# Patient Record
Sex: Female | Born: 1985 | Race: Black or African American | Hispanic: No | Marital: Single | Smoking: Never smoker
Health system: Southern US, Community
[De-identification: ages and names within clinical notes are randomized; demographics above are authoritative.]

## PROBLEM LIST (undated history)

## (undated) DIAGNOSIS — R51 Headache: Secondary | ICD-10-CM

## (undated) DIAGNOSIS — R519 Headache, unspecified: Secondary | ICD-10-CM

## (undated) DIAGNOSIS — D649 Anemia, unspecified: Secondary | ICD-10-CM

## (undated) DIAGNOSIS — J329 Chronic sinusitis, unspecified: Secondary | ICD-10-CM

## (undated) DIAGNOSIS — T7840XA Allergy, unspecified, initial encounter: Secondary | ICD-10-CM

## (undated) HISTORY — DX: Allergy, unspecified, initial encounter: T78.40XA

## (undated) HISTORY — PX: FINGER SURGERY: SHX640

## (undated) HISTORY — DX: Anemia, unspecified: D64.9

## (undated) HISTORY — PX: NO PAST SURGERIES: SHX2092

---

## 2013-08-21 ENCOUNTER — Encounter (HOSPITAL_COMMUNITY): Payer: Self-pay | Admitting: *Deleted

## 2013-08-21 ENCOUNTER — Inpatient Hospital Stay (HOSPITAL_COMMUNITY)
Admission: AD | Admit: 2013-08-21 | Discharge: 2013-08-21 | Disposition: A | Discharge status: Home / Self Care | Payer: Self-pay | Source: Ambulatory Visit | Attending: Family Medicine | Admitting: Family Medicine

## 2013-08-21 DIAGNOSIS — J029 Acute pharyngitis, unspecified: Secondary | ICD-10-CM

## 2013-08-21 DIAGNOSIS — R51 Headache: Secondary | ICD-10-CM | POA: Insufficient documentation

## 2013-08-21 HISTORY — DX: Chronic sinusitis, unspecified: J32.9

## 2013-08-21 LAB — URINALYSIS, ROUTINE W REFLEX MICROSCOPIC
Bilirubin Urine: NEGATIVE
GLUCOSE, UA: NEGATIVE mg/dL
Ketones, ur: NEGATIVE mg/dL
Leukocytes, UA: NEGATIVE
Nitrite: NEGATIVE
PH: 5 (ref 5.0–8.0)
Protein, ur: NEGATIVE mg/dL
Specific Gravity, Urine: 1.025 (ref 1.005–1.030)
Urobilinogen, UA: 0.2 mg/dL (ref 0.0–1.0)

## 2013-08-21 LAB — URINE MICROSCOPIC-ADD ON

## 2013-08-21 LAB — POCT PREGNANCY, URINE: PREG TEST UR: NEGATIVE

## 2013-08-21 MED ORDER — AMOXICILLIN 500 MG PO CAPS: 500.0000 mg | ORAL_CAPSULE | Freq: Three times a day (TID) | ORAL | Status: DC

## 2013-08-21 NOTE — MAU Provider Note (Signed)
  History     CSN: [id number]  Arrival date and time: 08/21/13 [id number]   First Provider Initiated Contact with Patient 08/21/13 1946      Chief Complaint  Patient presents with  . Headache  . Dizziness   HPI Comments: Madison Kent 28 y.o. G0P0 presents to MAU with sore throat, body aches, fevers and vertigo for 1 week. She has been taking Motrin for fevers and body aches. Her right ear also hurts her. No contact with others who are ill.    Headache  Associated symptoms include dizziness, a fever and a sore throat.  Dizziness Associated symptoms include a fever, headaches and a sore throat.      Past Medical History  Diagnosis Date  . Sinusitis nasal     Past Surgical History  Procedure Laterality Date  . No past surgeries      History reviewed. No pertinent family history.  History  Substance Use Topics  . Smoking status: Never Smoker   . Smokeless tobacco: Not on file  . Alcohol Use: No    Allergies: No Known Allergies  Prescriptions prior to admission  Medication Sig Dispense Refill  . ibuprofen (ADVIL,MOTRIN) 200 MG tablet Take 400 mg by mouth every 6 (six) hours as needed for headache.      . Vitamin D, Ergocalciferol, (DRISDOL) 50000 UNITS CAPS capsule Take 50,000 Units by mouth every 7 (seven) days.        Review of Systems  Constitutional: Positive for fever.  HENT: Positive for sore throat.   Respiratory: Negative.   Cardiovascular: Negative.   Gastrointestinal: Negative.   Genitourinary: Negative.   Musculoskeletal: Negative.   Neurological: Positive for dizziness and headaches.  Psychiatric/Behavioral: Negative.    Physical Exam   Blood pressure 110/71, pulse 81, temperature 98.2 F (36.8 C), temperature source Oral, resp. rate 16, height 5\' 8"  (1.727 m), weight 69.4 kg (153 lb), last menstrual period 08/20/2013.  Physical Exam  Constitutional: She is oriented to person, place, and time. She appears well-developed and well-nourished. No  distress.  HENT:  Head: Normocephalic and atraumatic.  Right Ear: Hearing normal. Tympanic membrane is scarred.  Left Ear: Hearing normal. Tympanic membrane is scarred.  Mouth/Throat: Posterior oropharyngeal erythema present. No oropharyngeal exudate, posterior oropharyngeal edema or tonsillar abscesses.  Cardiovascular: Normal rate, regular rhythm and normal heart sounds.   Respiratory: Effort normal and breath sounds normal.  GI: Soft. Bowel sounds are normal.  Genitourinary:  Not examined  Musculoskeletal: Normal range of motion.  Neurological: She is alert and oriented to person, place, and time.  Skin: Skin is warm and dry.  Psychiatric: She has a normal mood and affect. Her behavior is normal. Judgment and thought content normal.    MAU Course  Procedures  MDM   Assessment and Plan   A: Pharyngitis  P: Amoxicillin 500 mg TID x 5 days Fluids/ motrin/ rest Follow up with PCP   Madison Kent, Madison Kent 08/21/2013, 7:53 PM

## 2013-08-21 NOTE — Discharge Instructions (Signed)

## 2013-08-21 NOTE — MAU Note (Addendum)
Headache, dizzy at times, body aches. Reports ? Fever at times, when asked states has felt hot.  ibuporfen has helped some.  Denies sore throat.pain. below right ear to jaw line and down throat symptoms have lasted about a week.

## 2013-08-23 NOTE — MAU Provider Note (Signed)
Attestation of Attending Supervision of Advanced Practitioner (PA/CNM/NP): Evaluation and management procedures were performed by the Advanced Practitioner under my supervision and collaboration.  I have reviewed the Advanced Practitioner's note and chart, and I agree with the management and plan.  Reva BoresPRATT,TANYA S, MD [facility] Attending 08/23/2013 10:17 AM

## 2013-10-30 ENCOUNTER — Encounter: Payer: Self-pay | Admitting: Nurse Practitioner

## 2013-10-30 ENCOUNTER — Encounter (HOSPITAL_COMMUNITY): Payer: Self-pay | Admitting: *Deleted

## 2013-10-30 ENCOUNTER — Inpatient Hospital Stay (HOSPITAL_COMMUNITY)
Admission: AD | Admit: 2013-10-30 | Discharge: 2013-10-30 | Disposition: A | Discharge status: Home / Self Care | Payer: Self-pay | Source: Ambulatory Visit | Attending: Family Medicine | Admitting: Family Medicine

## 2013-10-30 DIAGNOSIS — D509 Iron deficiency anemia, unspecified: Secondary | ICD-10-CM | POA: Insufficient documentation

## 2013-10-30 DIAGNOSIS — N949 Unspecified condition associated with female genital organs and menstrual cycle: Secondary | ICD-10-CM | POA: Insufficient documentation

## 2013-10-30 DIAGNOSIS — N938 Other specified abnormal uterine and vaginal bleeding: Secondary | ICD-10-CM | POA: Insufficient documentation

## 2013-10-30 DIAGNOSIS — N939 Abnormal uterine and vaginal bleeding, unspecified: Secondary | ICD-10-CM

## 2013-10-30 DIAGNOSIS — D6489 Other specified anemias: Secondary | ICD-10-CM

## 2013-10-30 LAB — CBC
HEMATOCRIT: 35.5 % — AB (ref 36.0–46.0)
Hemoglobin: 11.2 g/dL — ABNORMAL LOW (ref 12.0–15.0)
MCH: 21.2 pg — ABNORMAL LOW (ref 26.0–34.0)
MCHC: 31.5 g/dL (ref 30.0–36.0)
MCV: 67.2 fL — AB (ref 78.0–100.0)
Platelets: 289 10*3/uL (ref 150–400)
RBC: 5.28 MIL/uL — ABNORMAL HIGH (ref 3.87–5.11)
RDW: 17.7 % — AB (ref 11.5–15.5)
WBC: 5.9 10*3/uL (ref 4.0–10.5)

## 2013-10-30 LAB — URINALYSIS, ROUTINE W REFLEX MICROSCOPIC
BILIRUBIN URINE: NEGATIVE
Glucose, UA: NEGATIVE mg/dL
KETONES UR: NEGATIVE mg/dL
Leukocytes, UA: NEGATIVE
Nitrite: NEGATIVE
Protein, ur: NEGATIVE mg/dL
SPECIFIC GRAVITY, URINE: 1.02 (ref 1.005–1.030)
UROBILINOGEN UA: 0.2 mg/dL (ref 0.0–1.0)
pH: 5.5 (ref 5.0–8.0)

## 2013-10-30 LAB — WET PREP, GENITAL
Clue Cells Wet Prep HPF POC: NONE SEEN
Trich, Wet Prep: NONE SEEN
Yeast Wet Prep HPF POC: NONE SEEN

## 2013-10-30 LAB — POCT PREGNANCY, URINE: Preg Test, Ur: NEGATIVE

## 2013-10-30 LAB — HIV ANTIBODY (ROUTINE TESTING W REFLEX): HIV 1&2 Ab, 4th Generation: NONREACTIVE

## 2013-10-30 LAB — URINE MICROSCOPIC-ADD ON

## 2013-10-30 MED ORDER — KETOROLAC TROMETHAMINE 60 MG/2ML IM SOLN
60.0000 mg | Freq: Once | INTRAMUSCULAR | Status: AC
  Administered 2013-10-30: 60 mg via INTRAMUSCULAR
  Filled 2013-10-30: qty 2

## 2013-10-30 MED ORDER — MEDROXYPROGESTERONE ACETATE 5 MG PO TABS: 5.0000 mg | ORAL_TABLET | Freq: Every day | ORAL | Status: DC

## 2013-10-30 NOTE — MAU Provider Note (Signed)
Attestation of Attending Supervision of Advanced Practitioner (PA/CNM/NP): Evaluation and management procedures were performed by the Advanced Practitioner under my supervision and collaboration.  I have reviewed the Advanced Practitioner's note and chart, and I agree with the management and plan.  Kobe Jansma, DO Attending Physician Faculty [facility], [facility]  

## 2013-10-30 NOTE — MAU Note (Signed)
Patient states she started her period about 2 weeks early on Friday 9-18. States she started having abdominal pain yesterday.

## 2013-10-30 NOTE — MAU Provider Note (Signed)
History     CSN: [id number]  Arrival date and time: 10/30/13 4098   First Provider Initiated Contact with Patient 10/30/13 [phone]      Chief Complaint  Patient presents with  . Vaginal Bleeding  . Abdominal Pain   HPI  Ms. Madison Kent is a 28 y.o. female G0P0 who presents with vaginal bleeding that started 3 days ago; pt feels this is her menstrual cycle, however it is 2 weeks early. She had a normal menstrual cycle that started on Sept 4. She stopped bleeding and then started bleeding again 3 days ago.  She has had irregular cycles in the past, however has never had one 2 weeks early. She is having pain in her lower abdomen that feels like cramping.   OB History   Grav Para Term Preterm Abortions TAB SAB Ect Mult Living   0               Past Medical History  Diagnosis Date  . Sinusitis nasal     Past Surgical History  Procedure Laterality Date  . No past surgeries      History reviewed. No pertinent family history.  History  Substance Use Topics  . Smoking status: Never Smoker   . Smokeless tobacco: Not on file  . Alcohol Use: No    Allergies: No Known Allergies  No prescriptions prior to admission   Results for orders placed during the hospital encounter of 10/30/13 (from the past 48 hour(s))  URINALYSIS, ROUTINE W REFLEX MICROSCOPIC     Status: Abnormal   Collection Time    10/30/13  8:50 AM      Result Value Ref Range   Color, Urine YELLOW  YELLOW   APPearance CLEAR  CLEAR   Specific Gravity, Urine 1.020  1.005 - 1.030   pH 5.5  5.0 - 8.0   Glucose, UA NEGATIVE  NEGATIVE mg/dL   Hgb urine dipstick LARGE (*) NEGATIVE   Bilirubin Urine NEGATIVE  NEGATIVE   Ketones, ur NEGATIVE  NEGATIVE mg/dL   Protein, ur NEGATIVE  NEGATIVE mg/dL   Urobilinogen, UA 0.2  0.0 - 1.0 mg/dL   Nitrite NEGATIVE  NEGATIVE   Leukocytes, UA NEGATIVE  NEGATIVE  URINE MICROSCOPIC-ADD ON     Status: None   Collection Time    10/30/13  8:50 AM      Result Value Ref Range   Squamous Epithelial / LPF RARE  RARE   RBC / HPF TOO NUMEROUS TO COUNT  <3 RBC/hpf  POCT PREGNANCY, URINE     Status: None   Collection Time    10/30/13  9:17 AM      Result Value Ref Range   Preg Test, Ur NEGATIVE  NEGATIVE   Comment:            THE SENSITIVITY OF THIS     METHODOLOGY IS >24 mIU/mL    Review of Systems  Constitutional: Negative for fever and chills.  Gastrointestinal: Positive for abdominal pain (Lower abdominal pain ). Negative for nausea, vomiting, diarrhea and constipation.  Genitourinary:       +vaginal bleeding (heavy)  Neurological: Negative for dizziness.   Physical Exam   Blood pressure 117/79, pulse 66, temperature 98 F (36.7 C), temperature source Oral, resp. rate 16, height  (1.778 m), weight 70.489 kg (155 lb 6.4 oz), last menstrual period 10/27/2013.  Physical Exam  Constitutional: She is oriented to person, place, and time. She appears well-developed and well-nourished. No distress.  HENT:  Head: Normocephalic.  Eyes: Pupils are equal, round, and reactive to light.  Neck: Neck supple.  Respiratory: Effort normal.  GI: Soft. There is generalized tenderness. There is no rigidity, no rebound and no guarding.  Genitourinary:  Speculum exam: Vagina -Moderate amount of dark red blood pooling in the vaginal canal. No clots noted  Cervix - +active bleeding  Bimanual exam: Cervix closed, No CMT  Uterus non tender, normal size Right adnexal tenderness, + suprapubic tenderness  GC/Chlam, wet prep done Chaperone present for exam.   Musculoskeletal: Normal range of motion.  Neurological: She is alert and oriented to person, place, and time.  Skin: Skin is warm. She is not diaphoretic.  Psychiatric: Her behavior is normal.    MAU Course  Procedures None  MDM CBC shows mild anemia Wet prep GC HIV  Toradol 60 mg IM Patient rates her pain 4/10 at discharge home  Assessment and Plan  A: Abnormal vaginal bleeding Iron deficiency  anemia    P: Discharge home in stable condition RX: provera  X 5 days  Bleeding precautions Outpatient pelvic US; [place] will call to schedule Follow up in the clinic following pelvic US; clinic will call to schedule  Iron daily as prescribed on the bottle   Iona Hansen Aldred Mase, NP  10/30/2013, 1:34 PM

## 2013-10-30 NOTE — Discharge Instructions (Signed)
Anemia, Nonspecific °Anemia is a condition in which the concentration of red blood cells or hemoglobin in the blood is below normal. Hemoglobin is a substance in red blood cells that carries oxygen to the tissues of the body. Anemia results in not enough oxygen reaching these tissues.  °CAUSES  °Common causes of anemia include:  °· Excessive bleeding. Bleeding may be internal or external. This includes excessive bleeding from periods (in women) or from the intestine.   °· Poor nutrition.   °· Chronic kidney, thyroid, and liver disease.  °· Bone marrow disorders that decrease red blood cell production. °· Cancer and treatments for cancer. °· HIV, AIDS, and their treatments. °· Spleen problems that increase red blood cell destruction. °· Blood disorders. °· Excess destruction of red blood cells due to infection, medicines, and autoimmune disorders. °SIGNS AND SYMPTOMS  °· Minor weakness.   °· Dizziness.   °· Headache. °· Palpitations.   °· Shortness of breath, especially with exercise.   °· Paleness. °· Cold sensitivity. °· Indigestion. °· Nausea. °· Difficulty sleeping. °· Difficulty concentrating. °Symptoms may occur suddenly or they may develop slowly.  °DIAGNOSIS  °Additional blood tests are often needed. These help your health care provider determine the best treatment. Your health care provider will check your stool for blood and look for other causes of blood loss.  °TREATMENT  °Treatment varies depending on the cause of the anemia. Treatment can include:  °· Supplements of iron, vitamin B12, or folic acid.   °· Hormone medicines.   °· A blood transfusion. This may be needed if blood loss is severe.   °· Hospitalization. This may be needed if there is significant continual blood loss.   °· Dietary changes. °· Spleen removal. °HOME CARE INSTRUCTIONS °Keep all follow-up appointments. It often takes many weeks to correct anemia, and having your health care provider check on your condition and your response to  treatment is very important. °SEEK IMMEDIATE MEDICAL CARE IF:  °· You develop extreme weakness, shortness of breath, or chest pain.   °· You become dizzy or have trouble concentrating. °· You develop heavy vaginal bleeding.   °· You develop a rash.   °· You have bloody or black, tarry stools.   °· You faint.   °· You vomit up blood.   °· You vomit repeatedly.   °· You have abdominal pain. °· You have a fever or persistent symptoms for more than 2-3 days.   °· You have a fever and your symptoms suddenly get worse.   °· You are dehydrated.   °MAKE SURE YOU: °· Understand these instructions. °· Will watch your condition. °· Will get help right away if you are not doing well or get worse. °Document Released: 03/05/2004 Document Revised: 09/28/2012 Document Reviewed: 07/22/2012 °ExitCare® Patient Information ©2015 [employer]. This information is not intended to replace advice given to you by your health care provider. Make sure you discuss any questions you have with your health care provider. ° °Abnormal Uterine Bleeding °Abnormal uterine bleeding means bleeding from the vagina that is not your normal menstrual period. This can be: °· Bleeding or spotting between periods. °· Bleeding after sex (sexual intercourse). °· Bleeding that is heavier or more than normal. °· Periods that last longer than usual. °· Bleeding after menopause. °There are many problems that may cause this. Treatment will depend on the cause of the bleeding. Any kind of bleeding that is not normal should be reviewed by your doctor.  °HOME CARE °Watch your condition for any changes. These actions may lessen any discomfort you are having: °· Do not use tampons or   douches as told by your doctor. °· Change your pads often. °You should get regular pelvic exams and Pap tests. Keep all appointments for tests as told by your doctor. °GET HELP IF: °· You are bleeding for more than 1 week. °· You feel dizzy at times. °GET HELP RIGHT AWAY IF:  °· You pass  out. °· You have to change pads every 15 to 30 minutes. °· You have belly pain. °· You have a fever. °· You become sweaty or weak. °· You are passing large blood clots from the vagina. °· You feel sick to your stomach (nauseous) and throw up (vomit). °MAKE SURE YOU: °· Understand these instructions. °· Will watch your condition. °· Will get help right away if you are not doing well or get worse. °Document Released: 11/23/2008 Document Revised: 01/31/2013 Document Reviewed: 08/25/2012 °ExitCare® Patient Information ©2015 [employer]. This information is not intended to replace advice given to you by your health care provider. Make sure you discuss any questions you have with your health care provider. ° °

## 2013-10-31 LAB — GC/CHLAMYDIA PROBE AMP
CT PROBE, AMP APTIMA: NEGATIVE
GC Probe RNA: NEGATIVE

## 2013-11-03 ENCOUNTER — Other Ambulatory Visit (HOSPITAL_COMMUNITY): Payer: Self-pay | Admitting: Obstetrics and Gynecology

## 2013-11-03 DIAGNOSIS — N939 Abnormal uterine and vaginal bleeding, unspecified: Secondary | ICD-10-CM

## 2013-11-06 ENCOUNTER — Ambulatory Visit (HOSPITAL_COMMUNITY)
Admission: RE | Admit: 2013-11-06 | Discharge: 2013-11-06 | Disposition: A | Discharge status: Home / Self Care | Payer: Self-pay | Source: Ambulatory Visit | Attending: Obstetrics and Gynecology | Admitting: Obstetrics and Gynecology

## 2013-11-06 DIAGNOSIS — R1031 Right lower quadrant pain: Secondary | ICD-10-CM | POA: Insufficient documentation

## 2013-11-06 DIAGNOSIS — N939 Abnormal uterine and vaginal bleeding, unspecified: Secondary | ICD-10-CM

## 2013-11-06 DIAGNOSIS — N926 Irregular menstruation, unspecified: Secondary | ICD-10-CM | POA: Insufficient documentation

## 2013-11-06 DIAGNOSIS — N920 Excessive and frequent menstruation with regular cycle: Secondary | ICD-10-CM | POA: Insufficient documentation

## 2013-12-07 ENCOUNTER — Ambulatory Visit (INDEPENDENT_AMBULATORY_CARE_PROVIDER_SITE_OTHER): Payer: Self-pay | Admitting: Nurse Practitioner

## 2013-12-07 ENCOUNTER — Encounter: Payer: Self-pay | Admitting: Nurse Practitioner

## 2013-12-07 VITALS — BP 125/67 | HR 89 | Temp 97.6°F | Ht 70.0 in | Wt 158.7 lb

## 2013-12-07 DIAGNOSIS — D649 Anemia, unspecified: Secondary | ICD-10-CM

## 2013-12-07 DIAGNOSIS — N926 Irregular menstruation, unspecified: Secondary | ICD-10-CM

## 2013-12-07 NOTE — Patient Instructions (Signed)
Natural Family Planning Natural Family Planning (NFP) is a type of birth control without using any form of contraception. Women who use NFP should not have sexual intercourse when the ovary produces an egg (ovulation) during the menstrual cycle. The NFP method is safe and can prevent pregnancy. It is 75% effective when practiced right. The man needs to also understand this method of birth control and the woman needs to be aware of how her body functions during her menstrual cycle. NFP can also be used as a method of getting pregnant.  HOW THE NFP METHOD WORKS  A woman's menstrual period usually happens every 28-30 days (it can vary from 23-35 days).  Ovulation happens 12-14 days before the start of the next menstrual period (the fertile period). The egg is fertile for 24 hours and the sperm can live for 3 days or more. If there is sexual intercourse at this time, pregnancy can occur. THERE ARE MANY TYPES OF NFP METHODS USED TO PREVENT PREGNANCY  The basal body temperature method. Often times, there is a slight increase of body temperature when a woman ovulates. Take your temperature every morning before getting out of bed. Write the temperature on a chart. An increase in the temperature shows ovulation has happened. Do not have sexual intercourse from the menstrual period up to three days after the increase in the temperature. Note that the body temperature may increase as a result of fever, restless sleep, and working schedules.  The ovulation cervical mucus method. During the menstrual cycle, the cervical mucus changes from dry and sticky to wet and slippery. Check the mucus of the vagina every day to look for these changes. Just before ovulation, the mucus becomes wet and slippery. On the last day of wetness, ovulation happens. To avoid getting pregnant, sexual intercourse is safe for about 10 days after the menstrual period and on the dry mucus days. Do not have sexual intercourse when the mucus  starts to show up and not until 4 days after the wet and slippery mucus goes away. Sexual intercourse after the 4 days have passed until the menstrual period starts is a safe time. Note that the mucus from the vagina can increase because of a vaginal or cervical infection, lubricants, some medicines, and sexual excitement.  The symptothermal method. This method uses both the temperature and the ovulation methods. Combine the two methods above to prevent pregnancy.  The calendar method. Record your menstrual periods and length of the cycles for 6 months. This is helpful when the menstrual cycle varies in the length of the cycle. The length of a menstrual cycle is from day 1 of the present menstrual period to day 1 of the next menstrual period. Then, find your fertile days of the month and do not have sexual intercourse during that time. You may need help from your health care provider to find out your fertile days. There are some signs of ovulation that may be helpful when trying to find the time of ovulation. This includes vaginal spotting or abdominal cramps during the middle of your menstrual cycle. Not all women have these symptoms. YOU SHOULD NOT USE NFP IF:  You have very irregular menstrual periods and may skip months.  You have abnormal bleeding.  You have a vaginal or cervical infection.  You are on medicines that can affect the vaginal mucus or body temperature. These medicines include antibiotics, thyroid medicines, and antihistamines (cold and allergy medicine). Document Released: 07/15/2007 Document Revised: 01/31/2013 Document Reviewed: 07/29/2012   ExitCare Patient Information 2015 [employer]. This information is not intended to replace advice given to you by your health care provider. Make sure you discuss any questions you have with your health care provider.  

## 2013-12-07 NOTE — Progress Notes (Signed)
History:  Madison Kent is a 28 y.o. G0P0 who [facility] follow up from [facility]U with irregular menses. She had an ultrasound that was normal and since her MAU visit she has had a regular menses in October. She is in fact trying to get pregnant for the last 7 months. She is on iron tablets for anemia.   The following portions of the patient's history were reviewed and updated as appropriate: allergies, current medications, past family history, past medical history, past social history, past surgical history and problem list.  Review of Systems:    Objective:  Physical Exam BP 125/67  Pulse 89  Temp(Src) 97.6 F (36.4 C)  Ht 5\' 10"  (1.778 m)  Wt 158 lb 11.2 oz (71.986 kg)  BMI 22.77 kg/m2  LMP 11/26/2013 GENERAL: Well-developed, well-nourished female in no acute distress.  HEENT: Normocephalic, atraumatic.  NECK: Supple. Normal thyroid.  LUNGS: Normal rate. Clear to auscultation bilaterally.  HEART: Regular rate and rhythm with no adventitious sounds.   EXTREMITIES: No cyanosis, clubbing, or edema, 2+ distal pulses.   Labs and Imaging No results found.   Assessment & Plan:  Assessment:  A: Irregular Menses Anemia  Plans:  Advised of ultrasound results which were reviewed with Dr Edsel PetrinArnold Advised to start PNV daily Advised on Natural family planning methods RTC prn  Delbert PhenixLinda M Keyanah Kozicki, NP 12/07/2013 2:39 PM

## 2014-04-20 ENCOUNTER — Ambulatory Visit (INDEPENDENT_AMBULATORY_CARE_PROVIDER_SITE_OTHER): Payer: 59 | Admitting: Family Medicine

## 2014-04-20 ENCOUNTER — Encounter: Payer: Self-pay | Admitting: Family Medicine

## 2014-04-20 VITALS — BP 126/80 | HR 87 | Ht 69.75 in | Wt 163.0 lb

## 2014-04-20 DIAGNOSIS — Z319 Encounter for procreative management, unspecified: Secondary | ICD-10-CM | POA: Insufficient documentation

## 2014-04-20 DIAGNOSIS — Z0000 Encounter for general adult medical examination without abnormal findings: Secondary | ICD-10-CM | POA: Insufficient documentation

## 2014-04-20 DIAGNOSIS — D649 Anemia, unspecified: Secondary | ICD-10-CM

## 2014-04-20 LAB — COMPREHENSIVE METABOLIC PANEL
ALK PHOS: 46 U/L (ref 39–117)
ALT: 14 U/L (ref 0–35)
AST: 15 U/L (ref 0–37)
Albumin: 4.1 g/dL (ref 3.5–5.2)
BILIRUBIN TOTAL: 0.3 mg/dL (ref 0.2–1.2)
BUN: 10 mg/dL (ref 6–23)
CHLORIDE: 104 meq/L (ref 96–112)
CO2: 25 mEq/L (ref 19–32)
CREATININE: 0.62 mg/dL (ref 0.50–1.10)
Calcium: 9.2 mg/dL (ref 8.4–10.5)
GLUCOSE: 83 mg/dL (ref 70–99)
POTASSIUM: 4 meq/L (ref 3.5–5.3)
Sodium: 137 mEq/L (ref 135–145)
TOTAL PROTEIN: 6.8 g/dL (ref 6.0–8.3)

## 2014-04-20 LAB — CBC WITH DIFFERENTIAL/PLATELET
BASOS ABS: 0.1 10*3/uL (ref 0.0–0.1)
Basophils Relative: 1 % (ref 0–1)
Eosinophils Absolute: 0.2 10*3/uL (ref 0.0–0.7)
Eosinophils Relative: 3 % (ref 0–5)
HCT: 38.7 % (ref 36.0–46.0)
Hemoglobin: 11.7 g/dL — ABNORMAL LOW (ref 12.0–15.0)
LYMPHS PCT: 34 % (ref 12–46)
Lymphs Abs: 2.1 10*3/uL (ref 0.7–4.0)
MCH: 22 pg — ABNORMAL LOW (ref 26.0–34.0)
MCHC: 30.2 g/dL (ref 30.0–36.0)
MCV: 72.9 fL — ABNORMAL LOW (ref 78.0–100.0)
MONO ABS: 0.5 10*3/uL (ref 0.1–1.0)
MONOS PCT: 8 % (ref 3–12)
MPV: 10.4 fL (ref 8.6–12.4)
NEUTROS ABS: 3.3 10*3/uL (ref 1.7–7.7)
Neutrophils Relative %: 54 % (ref 43–77)
Platelets: 332 10*3/uL (ref 150–400)
RBC: 5.31 MIL/uL — AB (ref 3.87–5.11)
RDW: 15 % (ref 11.5–15.5)
WBC: 6.1 10*3/uL (ref 4.0–10.5)

## 2014-04-20 NOTE — Assessment & Plan Note (Signed)
Patient presenting with concerns after not conceiving after approximately 1 year of unprotected regular sexual intercourse with her husband. Given the transvaginal ultrasound noted in September 2015, in the future, I would like to follow-up on this finding (hyperplasia versus polyp versus neoplasm). -We'll test TSH today -Discussed natural family planning and provided a handout -Discussed that the patient's husband should obtain a PCP, as the first step in the evaluation would be a semen analysis

## 2014-04-20 NOTE — Progress Notes (Signed)
Patient ID: Madison Kent, female   DOB: 10/23/1985, 29 y.o.   MRN: [id number]  29 y.o. year old female presents to establish care.  Acute Concerns: Infertility: The patient states that her husband have been attempting to get pregnant for over one year now. She states that they have sex regularly and have been unable to conceive. She has never been pregnant. Neither herself nor her husband have ever had children. She was seen at the MAU in September 2015 due to concerns of infertility. At that time a transvaginal ultrasound was obtained which showed a heterogeneous and irregular endometrium measuring personally 9 mm in thickness, which was slightly concerning due to the stated LMP. At that time, they recommended considering an endometrial biopsy if the LMP was correct to do concerns for polyp versus neoplasm. She never obtained an biopsy. No history of sexuallytransmitted diseases, and the patient has tested negative in our system in the past.  Patient also appears concerned about liver cancer, as her father had liver cancer in the past (no known cause). She also requests a hepatitis titer for work.  ROS: Besides HPI, all negative except: Occasional right-sided pain (patient concerned about her liver) that is not associated with eating, however she feels could be associated with movement of her right arm.  Diet: Regular, however she avoids pork due to religion  Sexual/Birth History: G0 Have been trying to conceive for 1 year.  Regular menses every 28 days, bleeds 6-8 days   Past Medical History  Denies any pre-existing diseases, however anemia noted on problem list as well as irregular menses Denies past surgeries   Medications Biotin   Immunization:  Tdap/TD: Unknown, approximately 5 years ago.   Influenza: Didn't receive, declines  Cancer Screening:  Pap Smear: Never had one  Social:  [employer] at [employer]  Patient's primary language is [place], she does not require an interpreter   Married Never used tobacco Denies alcohol abuse  Denies recreational drug use  Family history Father: liver cancer  Physical Exam: Blood pressure 126/80, pulse 87, height 5' 9.75" (1.772 m), weight 163 lb (73.936 kg), SpO2 97 %. GEN: Thin female in no acute distress HEENT: Atraumatic, normocephalic. Tympanic membranes clear, nonerythematous, nonbulging. Pupils equal round reactive to light. Sclera anicteric. Moist mucous membranes, no tonsillar exudate or erythema. Good dentition. No thyromegaly or lymphadenopathy noted CARDIAC: Regular rate and rhythm, no murmurs rubs or gallops noted. No swelling noted RESP: No increased work of breathing. Lungs clear auscultation bilaterally with no wheezing, rhonchi, or crackles ABD: +bs. Soft, nondistended, nontender EXT: 5 out of 5 strength in the upper and lower external bilaterally. No gross deformities. Gait normal SKIN: No lesions or rashes noted  ASSESSMENT & PLAN: 29 y.o. female presents for annual well woman/preventative exam and GYN exam. Please see problem specific assessment and plan.

## 2014-04-20 NOTE — Assessment & Plan Note (Addendum)
Patient currently declines a flu vaccine. She has never had a Pap smear. She has been tested for GC/chlamydia in the past. -Patient to return for a Pap smear -Patient to receive a hepatitis titer for work -Given the concerns for thickened endometrium on transvaginal ultrasound in 2015, would consider repeating a transvaginal ultrasound after the patient's Pap smear. -If the patient endometrium continues to be thickened when compared to her LMP, would consider an endometrial biopsy -CBC obtained to evaluate for anemia (given this is on her problem list)  -CMP obtained to evaluate for renal and liver function.

## 2014-04-20 NOTE — Patient Instructions (Addendum)
It was nice to meet you! Please make a follow up appointment for a pap smear (at that time we may consider ordering a repeat ultrasound to look at your reproductive organs). Please have your husband establish care with a primary care physician, if you continue to be unsuccessful at getting pregnant the first step would be getting a semen analysis. Please keep a log book of your right-sided pain- when it occurs, what you're doing, and how long it lasts for.   Things to do to Keep yourself Healthy - Exercise at least 30-45 minutes a day,  3-4 days a week.  - Eat a low-fat diet with lots of fruits and vegetables, up to 7-9 servings per day. - Seatbelts can save your life. Wear them always. - Smoke detectors on every level of your home, check batteries every year. - Eye Doctor - have an eye exam every 1-2 years - Safe sex - if you may be exposed to STDs, use a condom. - Alcohol If you drink, do it moderately,less than 2 drinks per day. - Health Care Power of Attorney.  Choose someone to speak for you if you are not able. - Depression is common in our stressful world.If you're feeling down or losing interest in things you normally enjoy, please come in for a visit. - Violence - If anyone is threatening or hurting you, please call immediately.  Natural Family Planning Natural Family Planning (NFP) is a type of birth control without using any form of contraception. Women who use NFP should not have sexual intercourse when the ovary produces an egg (ovulation) during the menstrual cycle. The NFP method is safe and can prevent pregnancy. It is 75% effective when practiced right. The man needs to also understand this method of birth control and the woman needs to be aware of how her body functions during her menstrual cycle. NFP can also be used as a method of getting pregnant.  HOW THE NFP METHOD WORKS  A woman's menstrual period usually happens every 28-30 days (it can vary from 23-35  days).  Ovulation happens 12-14 days before the start of the next menstrual period (the fertile period). The egg is fertile for 24 hours and the sperm can live for 3 days or more. If there is sexual intercourse at this time, pregnancy can occur. THERE ARE MANY TYPES OF NFP METHODS USED TO PREVENT PREGNANCY  The basal body temperature method. Often times, there is a slight increase of body temperature when a woman ovulates. Take your temperature every morning before getting out of bed. Write the temperature on a chart. An increase in the temperature shows ovulation has happened. Do not have sexual intercourse from the menstrual period up to three days after the increase in the temperature. Note that the body temperature may increase as a result of fever, restless sleep, and working schedules.  The ovulation cervical mucus method. During the menstrual cycle, the cervical mucus changes from dry and sticky to wet and slippery. Check the mucus of the vagina every day to look for these changes. Just before ovulation, the mucus becomes wet and slippery. On the last day of wetness, ovulation happens. To avoid getting pregnant, sexual intercourse is safe for about 10 days after the menstrual period and on the dry mucus days. Do not have sexual intercourse when the mucus starts to show up and not until 4 days after the wet and slippery mucus goes away. Sexual intercourse after the 4 days have passed until the  menstrual period starts is a safe time. Note that the mucus from the vagina can increase because of a vaginal or cervical infection, lubricants, some medicines, and sexual excitement.  The symptothermal method. This method uses both the temperature and the ovulation methods. Combine the two methods above to prevent pregnancy.  The calendar method. Record your menstrual periods and length of the cycles for 6 months. This is helpful when the menstrual cycle varies in the length of the cycle. The length of a  menstrual cycle is from day 1 of the present menstrual period to day 1 of the next menstrual period. Then, find your fertile days of the month and do not have sexual intercourse during that time. You may need help from your health care provider to find out your fertile days. There are some signs of ovulation that may be helpful when trying to find the time of ovulation. This includes vaginal spotting or abdominal cramps during the middle of your menstrual cycle. Not all women have these symptoms. YOU SHOULD NOT USE NFP IF:  You have very irregular menstrual periods and may skip months.  You have abnormal bleeding.  You have a vaginal or cervical infection.  You are on medicines that can affect the vaginal mucus or body temperature. These medicines include antibiotics, thyroid medicines, and antihistamines (cold and allergy medicine). Document Released: 07/15/2007 Document Revised: 01/31/2013 Document Reviewed: 07/29/2012 [facility] Patient Information 2015 [place], [place]. This information is not intended to replace advice given to you by your health care provider. Make sure you discuss any questions you have with your health care provider.

## 2014-04-21 LAB — HEPATITIS B SURFACE ANTIBODY, QUANTITATIVE: Hep B S AB Quant (Post): 0 m[IU]/mL

## 2014-04-21 LAB — TSH: TSH: 0.797 u[IU]/mL (ref 0.350–4.500)

## 2014-04-30 ENCOUNTER — Encounter: Payer: Self-pay | Admitting: Family Medicine

## 2014-05-03 ENCOUNTER — Other Ambulatory Visit (HOSPITAL_COMMUNITY)
Admission: RE | Admit: 2014-05-03 | Discharge: 2014-05-03 | Disposition: A | Discharge status: Home / Self Care | Payer: 59 | Source: Ambulatory Visit | Attending: Family Medicine | Admitting: Family Medicine

## 2014-05-03 ENCOUNTER — Encounter: Payer: Self-pay | Admitting: Family Medicine

## 2014-05-03 ENCOUNTER — Ambulatory Visit (INDEPENDENT_AMBULATORY_CARE_PROVIDER_SITE_OTHER): Payer: 59 | Admitting: Family Medicine

## 2014-05-03 VITALS — BP 113/70 | HR 83 | Ht 70.0 in | Wt 161.9 lb

## 2014-05-03 DIAGNOSIS — Z01419 Encounter for gynecological examination (general) (routine) without abnormal findings: Secondary | ICD-10-CM | POA: Diagnosis not present

## 2014-05-03 DIAGNOSIS — Z124 Encounter for screening for malignant neoplasm of cervix: Secondary | ICD-10-CM

## 2014-05-03 DIAGNOSIS — Z0000 Encounter for general adult medical examination without abnormal findings: Secondary | ICD-10-CM | POA: Diagnosis not present

## 2014-05-03 NOTE — Patient Instructions (Signed)
It was nice to meet you today.  Someone will call with results of the pap smear.  Take care, Dr. BLeonard Schwartz

## 2014-05-03 NOTE — Progress Notes (Signed)
   Subjective:   Madison Kent is a 29 y.o. female here for pap smear.  Patient reports she is sexually active with a single female partner.  She has never had a pap smear.  She reports that she is currently on her menstrual cycle.  Review of Systems:  Per HPI. All other systems reviewed and are negative.   PMH, PSH, Medications, Allergies, and FmHx reviewed and updated in EMR.  Objective:  BP 113/70 mmHg  Pulse 83  Ht 5\' 10"  (1.778 m)  Wt 161 lb 14.4 oz (73.437 kg)  BMI 23.23 kg/m2  Gen:  29 y.o. female sitting in NAD GYN:  External genitalia within normal limits.  Vaginal mucosa pink, moist, normal rugae.  Nonfriable cervix without lesions. Bleeding noted on speculum exam.  Bimanual exam revealed normal, nongravid uterus.  No cervical motion tenderness. No adnexal masses bilaterally.      Assessment:     Madison Kent is a 29 y.o. female here for pap smear.    Plan:     See problem list for problem-specific plans.   Erasmo DownerAngela M Bacigalupo, MD PGY-1,  [facility] 05/03/2014  11:45 AM

## 2014-05-03 NOTE — Assessment & Plan Note (Signed)
Pap smear collected today 

## 2014-05-08 LAB — CYTOLOGY - PAP

## 2014-05-09 ENCOUNTER — Encounter: Payer: Self-pay | Admitting: Family Medicine

## 2014-06-18 ENCOUNTER — Other Ambulatory Visit (HOSPITAL_COMMUNITY): Payer: Self-pay | Admitting: Obstetrics & Gynecology

## 2014-06-18 DIAGNOSIS — N979 Female infertility, unspecified: Secondary | ICD-10-CM

## 2014-06-21 ENCOUNTER — Ambulatory Visit (HOSPITAL_COMMUNITY): Admission: RE | Admit: 2014-06-21 | Payer: 59 | Source: Ambulatory Visit

## 2014-07-03 ENCOUNTER — Ambulatory Visit (INDEPENDENT_AMBULATORY_CARE_PROVIDER_SITE_OTHER): Payer: 59 | Admitting: Family Medicine

## 2014-07-03 ENCOUNTER — Encounter: Payer: Self-pay | Admitting: Family Medicine

## 2014-07-03 VITALS — BP 111/78 | HR 81 | Temp 98.1°F | Ht 70.0 in | Wt 160.5 lb

## 2014-07-03 DIAGNOSIS — R1030 Lower abdominal pain, unspecified: Secondary | ICD-10-CM

## 2014-07-03 DIAGNOSIS — G8929 Other chronic pain: Secondary | ICD-10-CM | POA: Diagnosis not present

## 2014-07-03 DIAGNOSIS — R1031 Right lower quadrant pain: Secondary | ICD-10-CM

## 2014-07-03 LAB — POCT URINE PREGNANCY: PREG TEST UR: NEGATIVE

## 2014-07-03 NOTE — Progress Notes (Signed)
Patient ID: Madison Kent, female   DOB: 1985-06-26, 29 y.o.   MRN: [id number]  HPI:  Pt presents for a same day appointment to discuss R pelvic pain.  This is an present for over one year. It is there daily, but comes and goes throughout the day. Happens especially when she lifts something heavy or if she bends over. Has not seen anything bulge out. No abnormal vaginal discharge but occasionally gets itchy in her vaginal area. She is trying to get pregnant. Has a bowel movement 2-3 times per day. Her stools are normal. She has no blood in her stool and does not vomit. Eating and drinking well. Her periods occur regular and monthly. She has taken pain medication in the past, but is unsure of the name of this medicine.  ROS: See HPI  PMFSH: History of infertility  PHYSICAL EXAM: BP 111/78 mmHg  Pulse 81  Temp(Src) 98.1 F (36.7 C) (Oral)  Ht 5\' 10"  (1.778 m)  Wt 160 lb 8 oz (72.802 kg)  BMI 23.03 kg/m2  LMP 06/15/2014 Gen: No acute distress, pleasant, cooperative HEENT: Normocephalic, atraumatic Heart: Regular rate and rhythm, no murmur Lungs: Clear to auscultation bilaterally, normal respiratory effort Abdomen: Soft, nontender to palpation except for minimal tenderness in right lower quadrant. No peritoneal signs, no guarding, no organomegaly or palpable masses Neuro: Grossly nonfocal, speech normal GU: Bimanual exam reveals no cervical motion tenderness, no adnexal masses, no tenderness at all with bimanual exam. Normal uterus to palpation.  ASSESSMENT/PLAN:  Abdominal pain, chronic, right lower quadrant Chronic right lower quadrant pain for the last year. Has previously been evaluated with transvaginal ultrasound, which showed thickened and heterogeneous endometrium. Recommended repeat ultrasound to patient today, however she states she would prefer to do a CT scan. I think this is reasonable. We will get her set up for a CT scan of the abdomen and pelvis with contrast. She has  recently had normal lab work. Pregnancy test negative today. Counseled her to follow-up with Dr. Leonides Schanzorsey, her primary physician. She may eventually need a repeat ultrasound of her pelvis.      FOLLOW UP: F/u in several weeks for chronic RLQ pain with PCP  GrenadaBrittany J. Pollie MeyerMcIntyre, MD [facility]

## 2014-07-03 NOTE — Patient Instructions (Signed)
We will call you with an appointment for a CT scan of your abdomen and pelvis Schedule a follow up appointment with Dr. Leonides Schanzorsey to continue to discuss the pain you've been having You may need another ultrasound  Be well, Dr. Pollie MeyerMcIntyre

## 2014-07-04 ENCOUNTER — Telehealth: Payer: Self-pay | Admitting: *Deleted

## 2014-07-04 NOTE — Telephone Encounter (Signed)
Attempted to call pt with CT appt. Its Wednesday June 1st @ 8:30, arrive @ 8:15. Nothing by mouth after midnight. Morning of appt she has to drink 1st bottle of contrast @ 6:30 and 2nd bottle @ 7:30. She will have to pick up contrast at 1st floor radiology. LMOVM for pt to call us back Maree Erieeseree Blount, CMA

## 2014-07-05 DIAGNOSIS — R1031 Right lower quadrant pain: Secondary | ICD-10-CM

## 2014-07-05 DIAGNOSIS — G8929 Other chronic pain: Secondary | ICD-10-CM | POA: Insufficient documentation

## 2014-07-05 NOTE — Assessment & Plan Note (Addendum)
Chronic right lower quadrant pain for the last year. Has previously been evaluated with transvaginal ultrasound, which showed thickened and heterogeneous endometrium. Recommended repeat ultrasound to patient today, however she states she would prefer to do a CT scan. I think this is reasonable. We will get her set up for a CT scan of the abdomen and pelvis with contrast. She has recently had normal lab work. Pregnancy test negative today. Counseled her to follow-up with Dr. Leonides Schanzorsey, her primary physician. She may eventually need a repeat ultrasound of her pelvis.

## 2014-07-10 ENCOUNTER — Telehealth: Payer: Self-pay | Admitting: Family Medicine

## 2014-07-10 NOTE — Telephone Encounter (Signed)
Received notification from [facility] staff that physician to physician phone call required for insurance to approve CT abd pelvis with contrast. Called and spoke to physician reviewer. Received confirmation number: [id number] Valid (I think) until July 15.  FYI to [facility] red team. Latrelle DodrillBrittany J Isidro Monks, MD

## 2014-07-11 ENCOUNTER — Encounter: Payer: Self-pay | Admitting: Family Medicine

## 2014-07-11 ENCOUNTER — Encounter (HOSPITAL_COMMUNITY): Payer: Self-pay

## 2014-07-11 ENCOUNTER — Ambulatory Visit (HOSPITAL_COMMUNITY)
Admission: RE | Admit: 2014-07-11 | Discharge: 2014-07-11 | Disposition: A | Discharge status: Home / Self Care | Payer: 59 | Source: Ambulatory Visit | Attending: Family Medicine | Admitting: Family Medicine

## 2014-07-11 ENCOUNTER — Telehealth: Payer: Self-pay | Admitting: Family Medicine

## 2014-07-11 DIAGNOSIS — G8929 Other chronic pain: Secondary | ICD-10-CM

## 2014-07-11 DIAGNOSIS — R1030 Lower abdominal pain, unspecified: Secondary | ICD-10-CM

## 2014-07-11 DIAGNOSIS — R1031 Right lower quadrant pain: Secondary | ICD-10-CM | POA: Diagnosis present

## 2014-07-11 DIAGNOSIS — K5900 Constipation, unspecified: Secondary | ICD-10-CM | POA: Insufficient documentation

## 2014-07-11 MED ORDER — POLYETHYLENE GLYCOL 3350 17 GM/SCOOP PO POWD: 17.0000 g | Freq: Every day | ORAL | Status: DC

## 2014-07-11 MED ORDER — IOHEXOL 300 MG/ML  SOLN
80.0000 mL | Freq: Once | INTRAMUSCULAR | Status: AC | PRN
  Administered 2014-07-11: 100 mL via INTRAVENOUS

## 2014-07-11 NOTE — Telephone Encounter (Signed)
Will forward to PCP and ordering MD. 

## 2014-07-11 NOTE — Telephone Encounter (Signed)
Wants results from 8:30 CT scan today

## 2014-07-11 NOTE — Telephone Encounter (Signed)
Called pt and discussed CT results. CT without pathology except abundant stool burden. Advised: -start miralax 17g daily. rx sent. -schedule pelvic u/s to reassess endometrial lining -schedule f/u visit with PCP in several weeks (counseled pt to call clinic for this appointment).  Red team, please schedule for pelvic ultrasound. Order entered. FYI to Dr. Leonides Schanzorsey. Latrelle DodrillBrittany J Herberto Ledwell, MD

## 2014-07-19 ENCOUNTER — Ambulatory Visit (HOSPITAL_COMMUNITY): Payer: 59 | Attending: Family Medicine

## 2014-07-20 ENCOUNTER — Ambulatory Visit (HOSPITAL_COMMUNITY): Admission: RE | Admit: 2014-07-20 | Payer: 59 | Source: Ambulatory Visit

## 2014-09-11 ENCOUNTER — Ambulatory Visit (HOSPITAL_COMMUNITY)
Admission: RE | Admit: 2014-09-11 | Discharge: 2014-09-11 | Disposition: A | Discharge status: Home / Self Care | Payer: 59 | Source: Ambulatory Visit | Attending: Obstetrics & Gynecology | Admitting: Obstetrics & Gynecology

## 2014-09-11 DIAGNOSIS — N979 Female infertility, unspecified: Secondary | ICD-10-CM | POA: Diagnosis present

## 2014-09-11 MED ORDER — IOHEXOL 300 MG/ML  SOLN
30.0000 mL | Freq: Once | INTRAMUSCULAR | Status: AC | PRN
  Administered 2014-09-11: 30 mL

## 2014-09-20 ENCOUNTER — Ambulatory Visit (INDEPENDENT_AMBULATORY_CARE_PROVIDER_SITE_OTHER): Payer: 59 | Admitting: Physician Assistant

## 2014-09-20 ENCOUNTER — Encounter: Payer: Self-pay | Admitting: Family Medicine

## 2014-09-20 VITALS — BP 114/73 | HR 80 | Ht 68.0 in | Wt 163.0 lb

## 2014-09-20 DIAGNOSIS — B373 Candidiasis of vulva and vagina: Secondary | ICD-10-CM | POA: Diagnosis not present

## 2014-09-20 DIAGNOSIS — B3731 Acute candidiasis of vulva and vagina: Secondary | ICD-10-CM

## 2014-09-20 MED ORDER — TERCONAZOLE 0.4 % VA CREA: 1.0000 | TOPICAL_CREAM | Freq: Every day | VAGINAL | Status: DC

## 2014-09-20 NOTE — Progress Notes (Signed)
Patient ID: Madison Kent, female   DOB: 1985/11/26, 29 y.o.   MRN: [id number] History:  Madison Kent is a 29 y.o. G0P0 who presents to clinic today for vaginal irritation.   It has been ongoing x greater than one week.  She has not used medication for it.  She notices it more at night.  She notes burning and itching at introitus of vagina but also external genitalia and around anus.   No abdominal pain, trouble eating or pooping.  Denies vaginal discharge.     The following portions of the patient's history were reviewed and updated as appropriate: allergies, current medications, past family history, past medical history, past social history, past surgical history and problem list.  Review of Systems:  Pertinent ROS in HPI.  All other systems are negative.   Objective:  Physical Exam BP 114/73 mmHg  Pulse 80  Ht  (1.727 m)  Wt 163 lb (73.936 kg)  BMI 24.79 kg/m2  LMP 09/03/2014 GENERAL: Well-developed, well-nourished female in no acute distress.  HEENT: Normocephalic, atraumatic.  NECK: Supple.   RESPIRATORY: Normal rate.  CARDIOVASCULAR: No respiratory distress ABDOMEN: Soft, nontender, nondistended. No organomegaly. Normal bowel sounds appreciated in all quadrants.  PELVIC:  Discharge is visible without speculum.  Copious white, thick, clumpy discharge.   Normal external female genitalia. Vagina is pink and rugated.   Normal cervix contour.  Uterus is normal in size. No adnexal mass or tenderness.    Labs Wet prep pending  Assessment & Plan:  Assessment: Clinical diagnosis:  1. Vulvovaginal candidiasis      Plans: Rx: Terazol - use 1 app intravaginally qhs x 7 nights.  Also apply to affected area.   RTC PRN and for annual exam  Bertram Denver, PA-C 09/20/2014 3:34 PM

## 2014-09-20 NOTE — Addendum Note (Signed)
Addended by: Aldona Lento on: 09/20/2014 05:45 PM   Modules accepted: Orders

## 2014-09-20 NOTE — Patient Instructions (Signed)

## 2014-09-21 ENCOUNTER — Other Ambulatory Visit: Payer: Self-pay | Admitting: Family Medicine

## 2014-09-21 LAB — WET PREP, GENITAL
TRICH WET PREP: NONE SEEN
Yeast Wet Prep HPF POC: NONE SEEN

## 2014-09-21 MED ORDER — METRONIDAZOLE 500 MG PO TABS: 500.0000 mg | ORAL_TABLET | Freq: Two times a day (BID) | ORAL | Status: DC

## 2014-09-24 ENCOUNTER — Telehealth: Payer: Self-pay

## 2014-09-24 NOTE — Telephone Encounter (Signed)
Called pt and LM that there is a Rx at her [facility] pharmacy off [address] to please go pick up Rx and to take as prescribed.  Please call the Clinics when she gets the message.

## 2014-09-24 NOTE — Telephone Encounter (Signed)
-----   Message from Levie Heritage, DO sent at 09/21/2014  8:39 AM EDT ----- Mellody Drown prep showed BV - metronidazole sent to pharmacy.  Please let pt know

## 2014-09-25 NOTE — Telephone Encounter (Signed)
Called pt and LM to pick Rx at her [facility] off [address]; to take as prescribed.  If she has any questions to please give our office a call.  Letter sent.

## 2014-10-05 ENCOUNTER — Ambulatory Visit (INDEPENDENT_AMBULATORY_CARE_PROVIDER_SITE_OTHER): Payer: 59

## 2014-10-05 ENCOUNTER — Ambulatory Visit (INDEPENDENT_AMBULATORY_CARE_PROVIDER_SITE_OTHER): Payer: 59 | Admitting: Family Medicine

## 2014-10-05 VITALS — BP 102/70 | HR 75 | Temp 98.0°F | Resp 16 | Ht 70.0 in | Wt 160.8 lb

## 2014-10-05 DIAGNOSIS — R079 Chest pain, unspecified: Secondary | ICD-10-CM

## 2014-10-05 MED ORDER — MELOXICAM 7.5 MG PO TABS: 7.5000 mg | ORAL_TABLET | Freq: Every day | ORAL | Status: DC

## 2014-10-05 NOTE — Progress Notes (Signed)
[facility] [address] [place] [address] [phone]  Date:  10/05/2014   Name:  Madison Kent   DOB:  [birth year]   MRN:  [id number]  PCP:  Rodrigo Ran, MD    Chief Complaint: Chest Pain and Back Pain   History of Present Illness:  Madison Kent is a 29 y.o. very pleasant female patient who presents with the following:  Here today with concern regarding right sided chest pain that goes through to her back It has been present for for about a year, but it seems to be getting worse.   The pain will come and go, but is more persistent now No cough No SOB She is generally in good health.   LMP 2 weeks ago No known injury She is worried that this could indicate a breast issue and would like a mammogram   Patient Active Problem List   Diagnosis Date Noted  . Abdominal pain, chronic, right lower quadrant 07/05/2014  . Infertility management 04/20/2014  . Health care maintenance 04/20/2014  . Anemia 12/07/2013  . Irregular menses 12/07/2013    Past Medical History  Diagnosis Date  . Sinusitis nasal   . Anemia     Past Surgical History  Procedure Laterality Date  . No past surgeries      Social History  Substance Use Topics  . Smoking status: Never Smoker   . Smokeless tobacco: None  . Alcohol Use: No    Family History  Problem Relation Age of Onset  . Cancer Father     No Known Allergies  Medication list has been reviewed and updated.  Current Outpatient Prescriptions on File Prior to Visit  Medication Sig Dispense Refill  . Biotin 1000 MCG tablet Take 1,000 mcg by mouth 3 (three) times daily.    . ferrous fumarate (HEMOCYTE - 106 MG FE) 325 (106 FE) MG TABS tablet Take 1 tablet by mouth.    . metroNIDAZOLE (FLAGYL) 500 MG tablet Take 1 tablet (500 mg total) by mouth 2 (two) times daily. (Patient not taking: Reported on 10/05/2014) 14 tablet 0  . polyethylene glycol powder (GLYCOLAX/MIRALAX) powder Take 17 g by mouth daily.  (Patient not taking: Reported on 09/20/2014) 500 g 1  . terconazole (TERAZOL 7) 0.4 % vaginal cream Place 1 applicator vaginally at bedtime. Apply to external surface as well. (Patient not taking: Reported on 10/05/2014) 45 g 0   No current facility-administered medications on file prior to visit.    Review of Systems:  As per HPI- otherwise negative.   Physical Examination: Filed Vitals:   10/05/14 1651  BP: 102/70  Pulse: 75  Temp: 98 F (36.7 C)  Resp: 16   Filed Vitals:   10/05/14 1651  Height: 5\' 10"  (1.778 m)  Weight: 160 lb 12.8 oz (72.938 kg)   Body mass index is 23.07 kg/(m^2). Ideal Body Weight: Weight in (lb) to have BMI = 25: 173.9  GEN: WDWN, NAD, Non-toxic, A & O x 3, looks well HEENT: Atraumatic, Normocephalic. Neck supple. No masses, No LAD. Ears and Nose: No external deformity. CV: RRR, No M/G/R. No JVD. No thrill. No extra heart sounds. PULM: CTA B, no wheezes, crackles, rhonchi. No retractions. No resp. distress. No accessory muscle use. ABD: S, NT, ND. EXTR: No c/c/e NEURO Normal gait.  PSYCH: Normally interactive. Conversant. Not depressed or anxious appearing.  Calm demeanor.  Able to reproduce tenderness in the right chest wall and in the upper  right back, lateral to the scapula.  Tenderness in the chest is superior to the breast, in the pectoralis muscle. Normal ROM of the right shoulder No redness, rash, lesion or swelling Right breast is normal - no masses, discharge or dimpling.   [facility] reading (PRIMARY) by  Dr. Patsy Lager. CXR: negative CHEST 2 VIEW  COMPARISON: None.  FINDINGS: The heart size and mediastinal contours are within normal limits. Both lungs are clear. No pleural effusion or pneumothorax. The visualized skeletal structures are unremarkable.  IMPRESSION: No active cardiopulmonary disease.  Assessment and Plan: Right-sided chest pain - Plan: DG Chest 2 View, MM Digital Diagnostic Unilat R, meloxicam (MOBIC) 7.5 MG  tablet  Here today with right sided chest and back pain for about a year.   Reassured that this does not seem to be anything dangerous, suspect MSK pain She would like referral for mammo which is fine mobic to use as needed- she has not tried anything for pain so far  Signed Abbe Amsterdam, MD

## 2014-10-05 NOTE — Patient Instructions (Signed)
We will arrange for a mammogram to make sure there is no breast concern. However I think the pain is coming from the chest wall (muscles and bones in your chest) and is nothing dangerous  In the meantime take mobic once a day as needed for pain If you are getting worse or have other concerns please let us know!

## 2014-10-10 ENCOUNTER — Other Ambulatory Visit: Payer: Self-pay | Admitting: Family Medicine

## 2014-10-10 DIAGNOSIS — R079 Chest pain, unspecified: Secondary | ICD-10-CM

## 2014-10-10 DIAGNOSIS — N644 Mastodynia: Secondary | ICD-10-CM

## 2014-10-16 ENCOUNTER — Ambulatory Visit
Admission: RE | Admit: 2014-10-16 | Discharge: 2014-10-16 | Disposition: A | Discharge status: Home / Self Care | Payer: 59 | Source: Ambulatory Visit | Attending: Family Medicine | Admitting: Family Medicine

## 2014-10-16 DIAGNOSIS — N644 Mastodynia: Secondary | ICD-10-CM

## 2014-10-16 DIAGNOSIS — R079 Chest pain, unspecified: Secondary | ICD-10-CM

## 2014-10-17 ENCOUNTER — Encounter: Payer: Self-pay | Admitting: Family Medicine

## 2015-01-14 ENCOUNTER — Other Ambulatory Visit: Payer: Self-pay | Admitting: Obstetrics & Gynecology

## 2015-01-16 ENCOUNTER — Encounter (HOSPITAL_COMMUNITY): Payer: Self-pay

## 2015-01-22 ENCOUNTER — Ambulatory Visit (HOSPITAL_COMMUNITY): Payer: 59 | Admitting: Registered Nurse

## 2015-01-22 ENCOUNTER — Ambulatory Visit (HOSPITAL_COMMUNITY)
Admission: RE | Admit: 2015-01-22 | Discharge: 2015-01-22 | Disposition: A | Discharge status: Home / Self Care | Payer: 59 | Source: Ambulatory Visit | Attending: Obstetrics & Gynecology | Admitting: Obstetrics & Gynecology

## 2015-01-22 ENCOUNTER — Encounter (HOSPITAL_COMMUNITY)
Admission: RE | Disposition: A | Discharge status: Home / Self Care | Payer: Self-pay | Source: Ambulatory Visit | Attending: Obstetrics & Gynecology

## 2015-01-22 ENCOUNTER — Encounter (HOSPITAL_COMMUNITY): Payer: Self-pay | Admitting: *Deleted

## 2015-01-22 DIAGNOSIS — N840 Polyp of corpus uteri: Secondary | ICD-10-CM | POA: Diagnosis not present

## 2015-01-22 HISTORY — DX: Headache: R51

## 2015-01-22 HISTORY — DX: Headache, unspecified: R51.9

## 2015-01-22 HISTORY — PX: DILITATION & CURRETTAGE/HYSTROSCOPY WITH VERSAPOINT RESECTION: SHX5571

## 2015-01-22 LAB — CBC
HEMATOCRIT: 38.5 % (ref 36.0–46.0)
HEMOGLOBIN: 12.6 g/dL (ref 12.0–15.0)
MCH: 23.2 pg — ABNORMAL LOW (ref 26.0–34.0)
MCHC: 32.7 g/dL (ref 30.0–36.0)
MCV: 70.8 fL — AB (ref 78.0–100.0)
Platelets: 284 10*3/uL (ref 150–400)
RBC: 5.44 MIL/uL — ABNORMAL HIGH (ref 3.87–5.11)
RDW: 13.9 % (ref 11.5–15.5)
WBC: 8.9 10*3/uL (ref 4.0–10.5)

## 2015-01-22 LAB — PREGNANCY, URINE: Preg Test, Ur: NEGATIVE

## 2015-01-22 SURGERY — DILATATION & CURETTAGE/HYSTEROSCOPY WITH VERSAPOINT RESECTION
Anesthesia: General

## 2015-01-22 MED ORDER — MIDAZOLAM HCL 5 MG/5ML IJ SOLN
INTRAMUSCULAR | Status: DC | PRN
  Administered 2015-01-22: 2 mg via INTRAVENOUS

## 2015-01-22 MED ORDER — CEFAZOLIN SODIUM-DEXTROSE 2-3 GM-% IV SOLR
2.0000 g | INTRAVENOUS | Status: AC
  Administered 2015-01-22: 2 g via INTRAVENOUS

## 2015-01-22 MED ORDER — MEPERIDINE HCL 25 MG/ML IJ SOLN: 6.2500 mg | INTRAMUSCULAR | Status: DC | PRN

## 2015-01-22 MED ORDER — CHLOROPROCAINE HCL 1 % IJ SOLN
INTRAMUSCULAR | Status: AC
  Filled 2015-01-22: qty 30

## 2015-01-22 MED ORDER — MIDAZOLAM HCL 2 MG/2ML IJ SOLN
INTRAMUSCULAR | Status: AC
  Filled 2015-01-22: qty 2

## 2015-01-22 MED ORDER — FENTANYL CITRATE (PF) 100 MCG/2ML IJ SOLN
INTRAMUSCULAR | Status: DC | PRN
  Administered 2015-01-22 (×2): 25 ug via INTRAVENOUS
  Administered 2015-01-22: 50 ug via INTRAVENOUS

## 2015-01-22 MED ORDER — KETOROLAC TROMETHAMINE 30 MG/ML IJ SOLN
INTRAMUSCULAR | Status: DC | PRN
  Administered 2015-01-22: 30 mg via INTRAVENOUS

## 2015-01-22 MED ORDER — HYDROCODONE-ACETAMINOPHEN 7.5-325 MG PO TABS: 1.0000 | ORAL_TABLET | Freq: Once | ORAL | Status: DC | PRN

## 2015-01-22 MED ORDER — LIDOCAINE HCL (CARDIAC) 20 MG/ML IV SOLN
INTRAVENOUS | Status: DC | PRN
  Administered 2015-01-22: 50 mg via INTRAVENOUS

## 2015-01-22 MED ORDER — DEXAMETHASONE SODIUM PHOSPHATE 10 MG/ML IJ SOLN
INTRAMUSCULAR | Status: AC
  Filled 2015-01-22: qty 1

## 2015-01-22 MED ORDER — SODIUM CHLORIDE 0.9 % IR SOLN
Status: DC | PRN
  Administered 2015-01-22: 3000 mL

## 2015-01-22 MED ORDER — PROPOFOL 10 MG/ML IV BOLUS
INTRAVENOUS | Status: DC | PRN
  Administered 2015-01-22: 170 mg via INTRAVENOUS

## 2015-01-22 MED ORDER — ONDANSETRON HCL 4 MG/2ML IJ SOLN
INTRAMUSCULAR | Status: AC
  Filled 2015-01-22: qty 2

## 2015-01-22 MED ORDER — SILVER NITRATE-POT NITRATE 75-25 % EX MISC
CUTANEOUS | Status: DC | PRN
  Administered 2015-01-22: 1

## 2015-01-22 MED ORDER — FENTANYL CITRATE (PF) 100 MCG/2ML IJ SOLN
25.0000 ug | INTRAMUSCULAR | Status: DC | PRN
Start: 2015-01-22 — End: 2015-01-22

## 2015-01-22 MED ORDER — CHLOROPROCAINE HCL 1 % IJ SOLN
INTRAMUSCULAR | Status: DC | PRN
  Administered 2015-01-22: 20 mL

## 2015-01-22 MED ORDER — METOCLOPRAMIDE HCL 5 MG/ML IJ SOLN: 10.0000 mg | Freq: Once | INTRAMUSCULAR | Status: DC | PRN

## 2015-01-22 MED ORDER — LIDOCAINE HCL (CARDIAC) 20 MG/ML IV SOLN
INTRAVENOUS | Status: AC
  Filled 2015-01-22: qty 5

## 2015-01-22 MED ORDER — KETOROLAC TROMETHAMINE 30 MG/ML IJ SOLN
INTRAMUSCULAR | Status: AC
  Filled 2015-01-22: qty 1

## 2015-01-22 MED ORDER — SCOPOLAMINE 1 MG/3DAYS TD PT72
MEDICATED_PATCH | TRANSDERMAL | Status: AC
  Filled 2015-01-22: qty 1

## 2015-01-22 MED ORDER — SCOPOLAMINE 1 MG/3DAYS TD PT72
1.0000 | MEDICATED_PATCH | Freq: Once | TRANSDERMAL | Status: DC
  Administered 2015-01-22: 1.5 mg via TRANSDERMAL

## 2015-01-22 MED ORDER — OXYCODONE-ACETAMINOPHEN 7.5-325 MG PO TABS: 1.0000 | ORAL_TABLET | ORAL | Status: DC | PRN

## 2015-01-22 MED ORDER — FENTANYL CITRATE (PF) 100 MCG/2ML IJ SOLN
INTRAMUSCULAR | Status: AC
  Filled 2015-01-22: qty 2

## 2015-01-22 MED ORDER — DEXAMETHASONE SODIUM PHOSPHATE 4 MG/ML IJ SOLN
INTRAMUSCULAR | Status: DC | PRN
  Administered 2015-01-22: 5 mg via INTRAVENOUS

## 2015-01-22 MED ORDER — ONDANSETRON HCL 4 MG/2ML IJ SOLN
INTRAMUSCULAR | Status: DC | PRN
  Administered 2015-01-22: 4 mg via INTRAVENOUS

## 2015-01-22 MED ORDER — CEFAZOLIN SODIUM-DEXTROSE 2-3 GM-% IV SOLR
INTRAVENOUS | Status: AC
  Filled 2015-01-22: qty 50

## 2015-01-22 MED ORDER — LACTATED RINGERS IV SOLN
INTRAVENOUS | Status: DC
  Administered 2015-01-22 (×2): via INTRAVENOUS

## 2015-01-22 MED ORDER — PROPOFOL 10 MG/ML IV BOLUS
INTRAVENOUS | Status: AC
  Filled 2015-01-22: qty 20

## 2015-01-22 SURGICAL SUPPLY — 17 items
CANISTER SUCT 3000ML (MISCELLANEOUS) ×3 IMPLANT
CATH ROBINSON RED A/P 16FR (CATHETERS) ×3 IMPLANT
CLOTH BEACON ORANGE TIMEOUT ST (SAFETY) ×3 IMPLANT
CONTAINER PREFILL 10% NBF 60ML (FORM) ×3 IMPLANT
ELECTRODE ROLLER VERSAPOINT (ELECTRODE) IMPLANT
ELECTRODE RT ANGLE VERSAPOINT (CUTTING LOOP) ×3 IMPLANT
GLOVE BIO SURGEON STRL SZ 6.5 (GLOVE) ×2 IMPLANT
GLOVE BIO SURGEONS STRL SZ 6.5 (GLOVE) ×1
GLOVE BIOGEL PI IND STRL 7.0 (GLOVE) ×3 IMPLANT
GLOVE BIOGEL PI INDICATOR 7.0 (GLOVE) ×6
GOWN STRL REUS W/TWL LRG LVL3 (GOWN DISPOSABLE) ×6 IMPLANT
PACK VAGINAL MINOR WOMEN LF (CUSTOM PROCEDURE TRAY) ×3 IMPLANT
PAD OB MATERNITY 4.3X12.25 (PERSONAL CARE ITEMS) ×3 IMPLANT
TOWEL OR 17X24 6PK STRL BLUE (TOWEL DISPOSABLE) ×6 IMPLANT
TUBING AQUILEX INFLOW (TUBING) ×3 IMPLANT
TUBING AQUILEX OUTFLOW (TUBING) ×3 IMPLANT
WATER STERILE IRR 1000ML POUR (IV SOLUTION) ×3 IMPLANT

## 2015-01-22 NOTE — Discharge Summary (Signed)
  Physician Discharge Summary  Patient ID: Madison Kent MRN: [id number] DOB/AGE: [birth year] 29 y.o.  Admit date: 01/22/2015 Discharge date: 01/22/2015  Admission Diagnoses: Intrauterine Polyps  Discharge Diagnoses: Intrauterine Polyps        Active Problems:   * No active hospital problems. *   Discharged Condition: good  Hospital Course: Outpatient  Consults: None  Treatments: surgery: Hysteroscopy with Versapoint resection, D+C  Disposition: 01-Home or Self Care     Medication List    STOP taking these medications        meloxicam 7.5 MG tablet  Commonly known as:  MOBIC     metroNIDAZOLE 500 MG tablet  Commonly known as:  FLAGYL     polyethylene glycol powder powder  Commonly known as:  GLYCOLAX/MIRALAX     terconazole 0.4 % vaginal cream  Commonly known as:  TERAZOL 7      TAKE these medications        ferrous fumarate 325 (106 FE) MG Tabs tablet  Commonly known as:  HEMOCYTE - 106 mg FE  Take 1 tablet by mouth.     HAIR/SKIN/NAILS PO  Place 2 tablets under the tongue daily.     oxyCODONE-acetaminophen 7.5-325 MG tablet  Commonly known as:  PERCOCET  Take 1 tablet by mouth every 4 (four) hours as needed for severe pain.     Vitamin D (Ergocalciferol) 50000 UNITS Caps capsule  Commonly known as:  DRISDOL  Take 50,000 Units by mouth every 7 (seven) days.           Follow-up Information    Follow up with [name],[name], MD In 3 weeks.   Specialty:  Obstetrics and Gynecology   Contact information:   [address] [place] [id number] [phone]       Signed: Genia DelLAVOIE,[name], MD 01/22/2015, 3:08 PM

## 2015-01-22 NOTE — H&P (Signed)
Madison Kent is an 29 y.o. female G0P0  RP:  [facility]  Pertinent Gynecological History: Menses: Menometro Contraception: none Blood transfusions: none Sexually transmitted diseases: no past history Previous GYN Procedures: None Last pap: normal  OB History: G0P0   Menstrual History:  Patient's last menstrual period was 01/05/2015 (exact date).    Past Medical History  Diagnosis Date  . Sinusitis nasal   . Anemia   . Headache     Sinus    Past Surgical History  Procedure Laterality Date  . No past surgeries    . Finger surgery      Family History  Problem Relation Age of Onset  . Cancer Father     Social History:  reports that she has never smoked. She has never used smokeless tobacco. She reports that she does not drink alcohol or use illicit drugs.  Allergies: No Known Allergies  Prescriptions prior to admission  Medication Sig Dispense Refill Last Dose  . ferrous fumarate (HEMOCYTE - 106 MG FE) 325 (106 FE) MG TABS tablet Take 1 tablet by mouth.   Past Week at Unknown time  . Multiple Vitamins-Minerals (HAIR/SKIN/NAILS PO) Place 2 tablets under the tongue daily.   Past Week at Unknown time  . Vitamin D, Ergocalciferol, (DRISDOL) 50000 UNITS CAPS capsule Take 50,000 Units by mouth every 7 (seven) days.   Past Week at Unknown time  . meloxicam (MOBIC) 7.5 MG tablet Take 1 tablet (7.5 mg total) by mouth daily. (Patient not taking: Reported on 01/09/2015) 30 tablet 0 Not Taking at Unknown time  . metroNIDAZOLE (FLAGYL) 500 MG tablet Take 1 tablet (500 mg total) by mouth 2 (two) times daily. (Patient not taking: Reported on 10/05/2014) 14 tablet 0 Completed Course at Unknown time  . polyethylene glycol powder (GLYCOLAX/MIRALAX) powder Take 17 g by mouth daily. (Patient not taking: Reported on 09/20/2014) 500 g 1 Not Taking at Unknown time  . terconazole (TERAZOL 7) 0.4 % vaginal cream Place 1 applicator vaginally at bedtime. Apply to external surface  as well. (Patient not taking: Reported on 10/05/2014) 45 g 0 Completed Course at Unknown time    ROS Neg  Blood pressure 116/81, pulse 76, temperature 98.4 F (36.9 C), temperature source Oral, resp. rate 18, height 5\' 10"  (1.778 m), weight 140 lb (63.504 kg), last menstrual period 01/05/2015, SpO2 99 %. Physical Exam   [place]ohysto:  Largest IU polyp 1.3 cm, others < 1 cm.  Results for orders placed or performed during the hospital encounter of 01/22/15 (from the past 24 hour(s))  Pregnancy, urine     Status: None   Collection Time: 01/22/15 12:15 PM  Result Value Ref Range   Preg Test, Ur NEGATIVE NEGATIVE  CBC     Status: Abnormal   Collection Time: 01/22/15 12:40 PM  Result Value Ref Range   WBC 8.9 4.0 - 10.5 K/uL   RBC 5.44 (H) 3.87 - 5.11 MIL/uL   Hemoglobin 12.6 12.0 - 15.0 g/dL   HCT 16.138.5 09.636.0 - 04.546.0 %   MCV 70.8 (L) 78.0 - 100.0 fL   MCH 23.2 (L) 26.0 - 34.0 pg   MCHC 32.7 30.0 - 36.0 g/dL   RDW 40.913.9 81.111.5 - 91.415.5 %   Platelets 284 150 - 400 K/uL    Assessment/Plan: IU polyps for [facility]SC Resection/D+C.  Surgery and risks reviewed.    Madison Kent,Madison Kent 01/22/2015, 1:36 PM

## 2015-01-22 NOTE — Op Note (Signed)
01/22/2015  2:53 PM  PATIENT:  Madison Kent  29 y.o. female  PRE-OPERATIVE DIAGNOSIS:  Intrauterine Polyps  POST-OPERATIVE DIAGNOSIS:  Intrauterine Polyps  PROCEDURE:  Procedure(s): DILATATION & CURETTAGE/HYSTEROSCOPY WITH VERSAPOINT RESECTION  SURGEON:  Surgeon(s): Genia DelMarie-Lyne Aubrynn Katona, MD  ASSISTANTS: none   ANESTHESIA:   general   PROCEDURE:  Under general anesthesia with laryngeal mask the patient is an lithotomy position.  She is prepped with Betadine on the suprapubic, vulvar and vaginal areas.  The bladder was catheterized.  The vaginal exam reveals a retroverted uterus, normal volume, with no adnexal mass.  The speculum was inserted into the vagina and the anterior lip of the cervix is grasped with a tenaculum.  The cervix is dilated with Hegar dilators up to #33 without difficulty.  The operative hysteroscope was inserted in the intrauterine cavity with the VersaPoint loop.  Visualization of the intrauterine cavity reveals normal ostia with a normal shape of the cavity, but areas of thickened endometrium with polyps.  The largest polyps comes from the left anterior wall, but smaller polyps are also present on the mid anterior wall and the posterior wall.  All polyps are resected.  The hysteroscope was then removed.  A small sharp curet was used to gently curettage all surfaces of the intrauterine cavity.  Both specimens were sent together to pathology.  We then went back with the hysteroscope and confirmed a normal cavity without polyps and good hemostasis.  The hysteroscope was removed, the tenaculum was removed and hemostasis was completed at the cervix with silver nitrate.  The speculum was removed. The patient was brought to recovery room in good and stable status.  ESTIMATED BLOOD LOSS: 25 cc  Fluid deficit 227 cc   Intake/Output Summary (Last 24 hours) at 01/22/15 1453 Last data filed at 01/22/15 1442  Gross per 24 hour  Intake   1000 ml  Output    300 ml  Net    700 ml     BLOOD ADMINISTERED:none   LOCAL MEDICATIONS USED:  Nesacaine 1% 20 cc for Paracervical block  SPECIMEN:  Source of Specimen:  Endometrial curettings and polyp resection material  DISPOSITION OF SPECIMEN:  PATHOLOGY  COUNTS:  YES  PLAN OF CARE: Transfer to PACU   Genia DelMarie-Lyne Corrina Steffensen MD  01/22/2015 at 2:55 pm

## 2015-01-22 NOTE — Anesthesia Procedure Notes (Signed)
Procedure Name: LMA Insertion Date/Time: 01/22/2015 2:06 PM Performed by: Jhonnie GarnerMARSHALL, Zareya Tuckett M Pre-anesthesia Checklist: Patient identified, Emergency Drugs available, Suction available and Patient being monitored Patient Re-evaluated:Patient Re-evaluated prior to inductionOxygen Delivery Method: Circle system utilized Preoxygenation: Pre-oxygenation with 100% oxygen Intubation Type: IV induction Ventilation: Mask ventilation without difficulty LMA: LMA inserted LMA Size: 4.0 Number of attempts: 1 Placement Confirmation: breath sounds checked- equal and bilateral and positive ETCO2 Tube secured with: Tape Dental Injury: Teeth and Oropharynx as per pre-operative assessment

## 2015-01-22 NOTE — Anesthesia Preprocedure Evaluation (Addendum)
Anesthesia Evaluation  Patient identified by MRN, date of birth, ID band Patient awake    Reviewed: Allergy & Precautions, H&P , NPO status , Patient's Chart, lab work & pertinent test results  Airway Mallampati: I  TM Distance: >3 FB Neck ROM: Full    Dental  (+) Teeth Intact   Pulmonary neg pulmonary ROS,    Pulmonary exam normal        Cardiovascular negative cardio ROS Normal cardiovascular exam Rhythm:regular Rate:Normal     Neuro/Psych  Headaches, negative psych ROS   GI/Hepatic negative GI ROS, Neg liver ROS,   Endo/Other  negative endocrine ROS  Renal/GU negative Renal ROS     Musculoskeletal   Abdominal Normal abdominal exam  (+)   Peds  Hematology negative hematology ROS (+) anemia ,   Anesthesia Other Findings   Reproductive/Obstetrics negative OB ROS                           Anesthesia Physical Anesthesia Plan  ASA: I  Anesthesia Plan: General   Post-op Pain Management:    Induction:   Airway Management Planned: LMA  Additional Equipment:   Intra-op Plan:   Post-operative Plan:   Informed Consent: I have reviewed the patients History and Physical, chart, labs and discussed the procedure including the risks, benefits and alternatives for the proposed anesthesia with the patient or authorized representative who has indicated his/her understanding and acceptance.   Dental Advisory Given and History available from chart only  Plan Discussed with: CRNA  Anesthesia Plan Comments:       Anesthesia Quick Evaluation

## 2015-01-22 NOTE — Transfer of Care (Signed)
Immediate Anesthesia Transfer of Care Note  Patient: Madison Kent  Procedure(s) Performed: Procedure(s): DILATATION & CURETTAGE/HYSTEROSCOPY WITH VERSAPOINT RESECTION (N/A)  Patient Location: PACU  Anesthesia Type:General  Level of Consciousness: awake, alert , oriented and patient cooperative  Airway & Oxygen Therapy: Patient Spontanous Breathing and Patient connected to nasal cannula oxygen  Post-op Assessment: Report given to RN and Post -op Vital signs reviewed and stable  Post vital signs: Reviewed and stable  Last Vitals:  Filed Vitals:   01/22/15 1245  BP: 116/81  Pulse: 76  Temp: 36.9 C  Resp: 18    Complications: No apparent anesthesia complications

## 2015-01-22 NOTE — Discharge Instructions (Addendum)
Hysteroscopy, Care After °Refer to this sheet in the next few weeks. These instructions provide you with information on caring for yourself after your procedure. Your health care provider may also give you more specific instructions. Your treatment has been planned according to current medical practices, but problems sometimes occur. Call your health care provider if you have any problems or questions after your procedure.  °WHAT TO EXPECT AFTER THE PROCEDURE °After your procedure, it is typical to have the following: °· You may have some cramping. This normally lasts for a couple days. °· You may have bleeding. This can vary from light spotting for a few days to menstrual-like bleeding for 3-7 days. °HOME CARE INSTRUCTIONS °· Rest for the first 1-2 days after the procedure. °· Only take over-the-counter or prescription medicines as directed by your health care provider. Do not take aspirin. It can increase the chances of bleeding. °· Take showers instead of baths for 2 weeks or as directed by your health care provider. °· Do not drive for 24 hours or as directed. °· Do not drink alcohol while taking pain medicine. °· Do not use tampons, douche, or have sexual intercourse for 2 weeks or until your health care provider says it is okay. °· Take your temperature twice a day for 4-5 days. Write it down each time. °· Follow your health care provider's advice about diet, exercise, and lifting. °· If you develop constipation, you may: °¨ Take a mild laxative if your health care provider approves. °¨ Add bran foods to your diet. °¨ Drink enough fluids to keep your urine clear or pale yellow. °· Try to have someone with you or available to you for the first 24-48 hours, especially if you were given a general anesthetic. °· Follow up with your health care provider as directed. °SEEK MEDICAL CARE IF: °· You feel dizzy or lightheaded. °· You feel sick to your stomach (nauseous). °· You have abnormal vaginal discharge. °· You  have a rash. °· You have pain that is not controlled with medicine. °SEEK IMMEDIATE MEDICAL CARE IF: °· You have bleeding that is heavier than a normal menstrual period. °· You have a fever. °· You have increasing cramps or pain, not controlled with medicine. °· You have new belly (abdominal) pain. °· You pass out. °· You have pain in the tops of your shoulders (shoulder strap areas). °· You have shortness of breath. °  °This information is not intended to replace advice given to you by your health care provider. Make sure you discuss any questions you have with your health care provider. °  °Document Released: 11/16/2012 Document Reviewed: 11/16/2012 °Elsevier Interactive Patient Education ©2016 [employer] ° ° °Post Anesthesia Home Care Instructions ° °Activity: °Get plenty of rest for the remainder of the day. A responsible adult should stay with you for 24 hours following the procedure.  °For the next 24 hours, DO NOT: °-Drive a car °-Operate machinery °-Drink alcoholic beverages °-Take any medication unless instructed by your physician °-Make any legal decisions or sign important papers. ° °Meals: °Start with liquid foods such as gelatin or soup. Progress to regular foods as tolerated. Avoid greasy, spicy, heavy foods. If nausea and/or vomiting occur, drink only clear liquids until the nausea and/or vomiting subsides. Call your physician if vomiting continues. ° °Special Instructions/Symptoms: °Your throat may feel dry or sore from the anesthesia or the breathing tube placed in your throat during surgery. If this causes discomfort, gargle with warm salt water. The   discomfort should disappear within 24 hours. ° °If you had a scopolamine patch placed behind your ear for the management of post- operative nausea and/or vomiting: ° °1. The medication in the patch is effective for 72 hours, after which it should be removed.  Wrap patch in a tissue and discard in the trash. Wash hands thoroughly with soap and  water. °2. You may remove the patch earlier than 72 hours if you experience unpleasant side effects which may include dry mouth, dizziness or visual disturbances. °3. Avoid touching the patch. Wash your hands with soap and water after contact with the patch. °  ° ° °

## 2015-01-22 NOTE — Anesthesia Postprocedure Evaluation (Signed)
Anesthesia Post Note  Patient: Madison Kent  Procedure(s) Performed: Procedure(s) (LRB): DILATATION & CURETTAGE/HYSTEROSCOPY WITH VERSAPOINT RESECTION (N/A)  Patient location during evaluation: PACU Anesthesia Type: General Level of consciousness: awake and alert Pain management: pain level controlled Vital Signs Assessment: post-procedure vital signs reviewed and stable Respiratory status: spontaneous breathing, nonlabored ventilation and respiratory function stable Cardiovascular status: blood pressure returned to baseline and stable Postop Assessment: no signs of nausea or vomiting Anesthetic complications: no    Last Vitals:  Filed Vitals:   01/22/15 1500 01/22/15 1515  BP: 113/78 111/76  Pulse: 79 78  Temp:    Resp: 13 16    Last Pain: There were no vitals filed for this visit.               Orazio Weller A.

## 2015-01-23 ENCOUNTER — Encounter (HOSPITAL_COMMUNITY): Payer: Self-pay | Admitting: Obstetrics & Gynecology

## 2015-04-05 ENCOUNTER — Encounter: Payer: Self-pay | Admitting: Family Medicine

## 2015-04-05 ENCOUNTER — Ambulatory Visit (INDEPENDENT_AMBULATORY_CARE_PROVIDER_SITE_OTHER): Payer: 59 | Admitting: Family Medicine

## 2015-04-05 VITALS — BP 110/60 | HR 86 | Temp 98.5°F | Ht 70.0 in | Wt 158.0 lb

## 2015-04-05 DIAGNOSIS — Z205 Contact with and (suspected) exposure to viral hepatitis: Secondary | ICD-10-CM

## 2015-04-05 DIAGNOSIS — R1011 Right upper quadrant pain: Secondary | ICD-10-CM | POA: Diagnosis not present

## 2015-04-05 DIAGNOSIS — G8929 Other chronic pain: Secondary | ICD-10-CM | POA: Diagnosis not present

## 2015-04-05 DIAGNOSIS — R1031 Right lower quadrant pain: Secondary | ICD-10-CM | POA: Diagnosis not present

## 2015-04-05 LAB — HEPATITIS B CORE ANTIBODY, TOTAL: HEP B C TOTAL AB: NONREACTIVE

## 2015-04-05 LAB — HEPATITIS B SURFACE ANTIBODY, QUANTITATIVE: Hepatitis B-Post: 0 m[IU]/mL

## 2015-04-05 LAB — HEPATITIS B SURFACE ANTIGEN: HEP B S AG: NEGATIVE

## 2015-04-05 LAB — HEPATITIS B CORE ANTIBODY, IGM: HEP B C IGM: NONREACTIVE

## 2015-04-05 MED ORDER — OMEPRAZOLE 20 MG PO CPDR: 20.0000 mg | DELAYED_RELEASE_CAPSULE | Freq: Every day | ORAL | Status: DC

## 2015-04-05 MED ORDER — DICLOFENAC SODIUM 75 MG PO TBEC: 75.0000 mg | DELAYED_RELEASE_TABLET | Freq: Two times a day (BID) | ORAL | Status: DC

## 2015-04-05 NOTE — Patient Instructions (Signed)

## 2015-04-05 NOTE — Progress Notes (Signed)
Subjective:   Madison Kent is a 30 y.o. female with a history of chronic RLQ pain.   Here for  Chief Complaint  Patient presents with  . right side pain    x 1 years    #Abdominal pain: chronic and present for > 1 year. Reports worsening in intensity and frequency. Reports alleviated by changes in position. Worse with bending forward.  Reports pain is intermittent and every day. Reports no particular time association. She reports no association with eating. Denies dysuria, polyuria. Denies worsening with menstrual cycle, Reports regular BM every 1-2 days that is soft. Denies vaginal discharge.   #Exposure to Hep B Father died of liver cancer from Hep B. She has not had full testing for Hep B and this was ordered today.   Health Maintenance Due  Topic Date Due  . TETANUS/TDAP  06/28/2006    Review of Systems:   Per HPI. All other systems reviewed and are negative expect as in HPI   PMH, PSH, Medications, Allergies, SocialHx and FHx reviewed and updated in EMR- marked as reviewed 04/05/2015   Objective:  BP 110/60 mmHg  Pulse 86  Temp(Src) 98.5 F (36.9 C) (Oral)  Ht  (1.778 m)  Wt 158 lb (71.668 kg)  BMI 22.67 kg/m2  SpO2 98%  LMP 03/24/2015  Gen:  30 y.o. female in NAD. Speaking in full sentences. Good eye contact HEENT: NCAT, MMM CV: RRR, no MRG, no JVD Resp: Normal WOB. Non-labored, CTAB, no wheezes noted GI: Soft, ND, BS present, no guarding or organomegaly. TTP over the RLQ with deep palpation. Ext: WWP, no edema MSK: Intact gait. Full ROM Neuro: Alert and oriented, speech normal Pysch: Normal mood and affect.       Chemistry      Component Value Date/Time   NA 137 04/20/2014 1544   K 4.0 04/20/2014 1544   CL 104 04/20/2014 1544   CO2 25 04/20/2014 1544   BUN 10 04/20/2014 1544   CREATININE 0.62 04/20/2014 1544      Component Value Date/Time   CALCIUM 9.2 04/20/2014 1544   ALKPHOS 46 04/20/2014 1544   AST 15 04/20/2014 1544   ALT 14  04/20/2014 1544   BILITOT 0.3 04/20/2014 1544      No results found for: HGBA1C Assessment:     Madison Kent is a 30 y.o. female here for right sided abdominal pain and right chest wall pain.     Plan:   #chronic abdominal pain/right side pain: Likely functional given extensive work up. She had a CT ab/pelvis that was benign in June 2016 and Pelvic US /hysterogram in Aug 2016.  ddx includes possible ovarian cyst (tho not seen on imaging), unlikely GI related.  -patient requested more imaging and we discussed that this is unlikely to benefit her at this point but can be done in the future -start trial of high dose NSAIDS - diclofenac 75 BID x 10 days with PPI  - RTC in 2 weeks and consider Abd [place] and/or transvaginal US if pain not improved. -Consider cymbalta for pain syndrome, avoid narcotics   #exposure to Hep B Orders Placed This Encounter  Procedures  . Hepatitis B surface antigen  . Hepatitis B surface antibody  . Hepatitis B core antibody, total  . Hepatitis B core antibody, IgM   >50% of this 25 minute visit was spent in direct counseling about chronic abdominal pain, discuss about appropriate testing, review of records and existing work up,  discussion of NSAID treatment and confirmation testing regarding exposure to Hep B  Federico Flake, MD, ABFM OB Fellow, [facility] 04/05/2015  6:08 PM

## 2015-04-08 ENCOUNTER — Telehealth: Payer: Self-pay | Admitting: *Deleted

## 2015-04-08 ENCOUNTER — Ambulatory Visit: Payer: 59 | Admitting: Family Medicine

## 2015-04-08 NOTE — Telephone Encounter (Signed)
LM for patient ok per DPR in chart.  Advised her to contact health department [phone]) to start this series if she is interested because we don't carry this vaccine in the office.  Vilas Edgerly,CMA

## 2015-04-08 NOTE — Telephone Encounter (Signed)
-----   Message from Federico Flake, MD sent at 04/06/2015  9:22 AM EST ----- Please call patient and let her know that she definitely does not have Hepatitis B, nor did she have it and clear the infection. She can start Hep B immunization series if she wants to. Dr. Alvester Morin

## 2015-10-01 IMAGING — CT CT ABD-PELV W/ CM
2 of 5 series · 15 of 46 positions shown, 17 images · IV contrast (Omni 300)
Comparison: None.

CLINICAL DATA: Right lower quadrant pain intermittent for 1 year,
constipation

EXAM:
CT ABDOMEN AND PELVIS WITH CONTRAST
TECHNIQUE: Multidetector CT imaging of the abdomen and pelvis was performed
using the standard protocol following bolus administration of
intravenous contrast.
CONTRAST:  100mL OMNIPAQUE IOHEXOL 300 MG/ML  SOLN

[Series 2: abd/ pelvis 5.0 i30f 1 · axial · 0.70mm/px · z∈[-498,-113]mm · 12 of 91 slices shown, 14 images]
[im 7/91  soft-tissue]
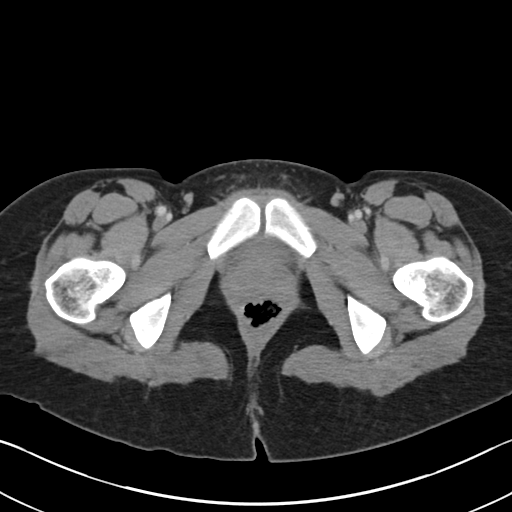
[im 7/91  bone]
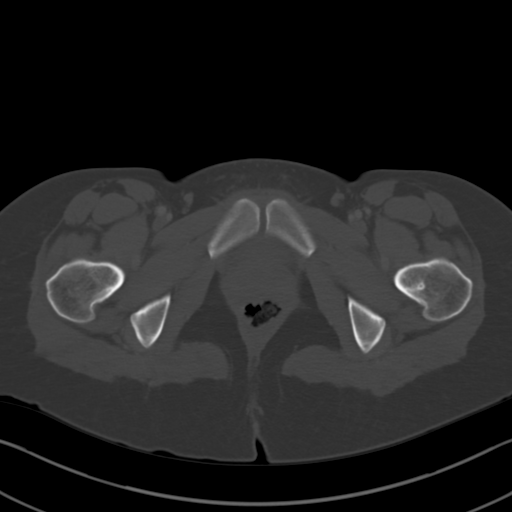
[im 14/91  soft-tissue]
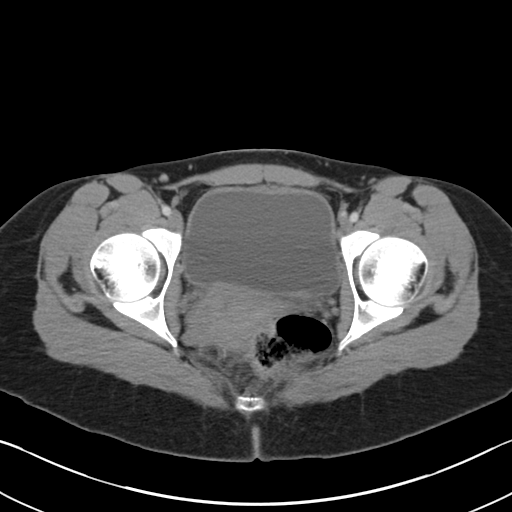
[im 21/91  soft-tissue]
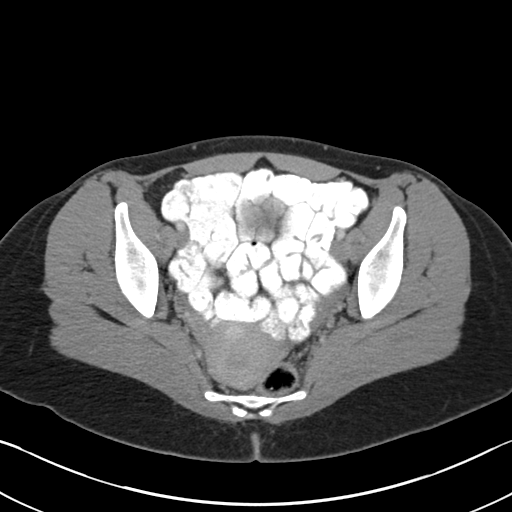
[im 28/91  soft-tissue]
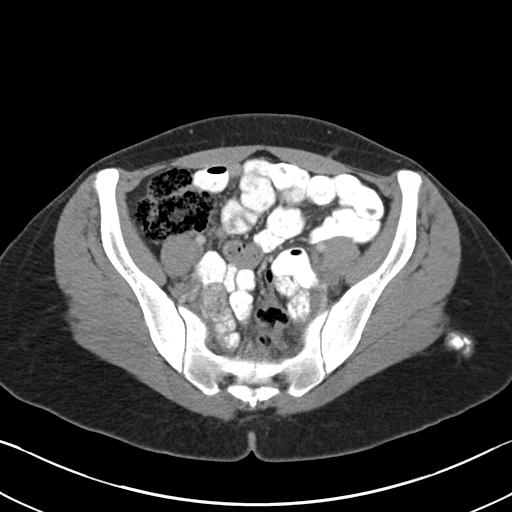
[im 35/91  soft-tissue]
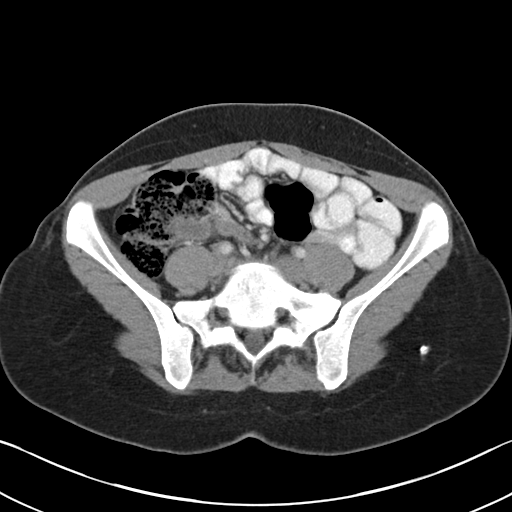
[im 42/91  soft-tissue]
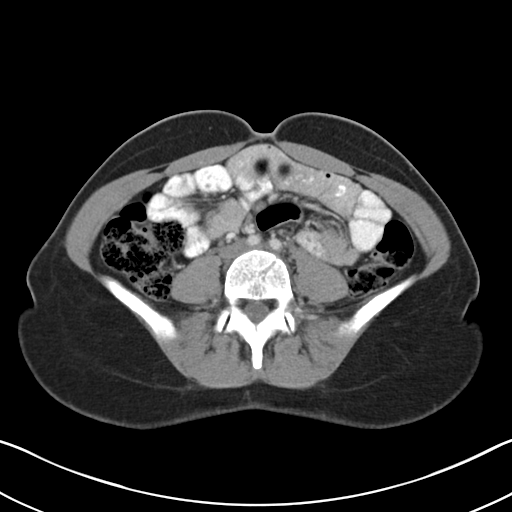
[im 49/91  soft-tissue]
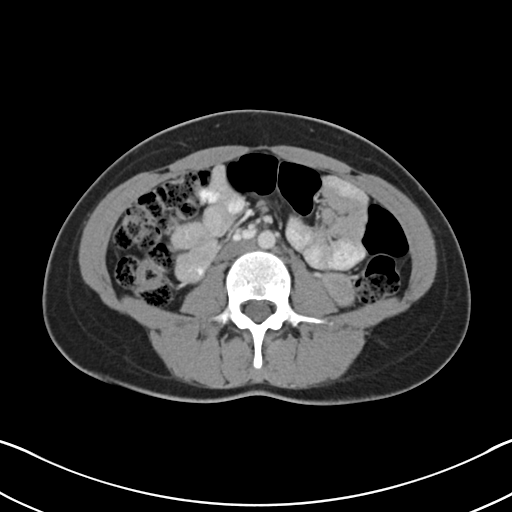
[im 56/91  soft-tissue]
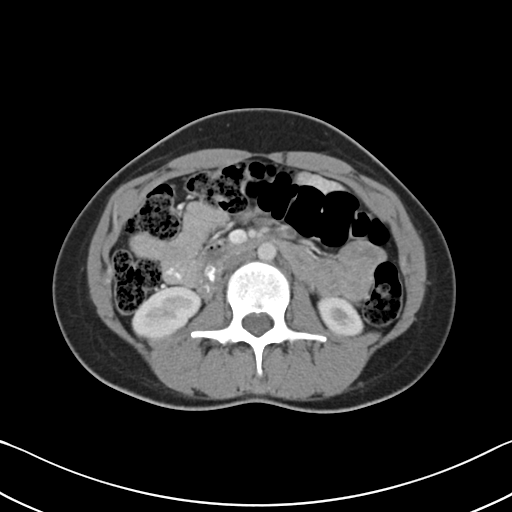
[im 63/91  soft-tissue]
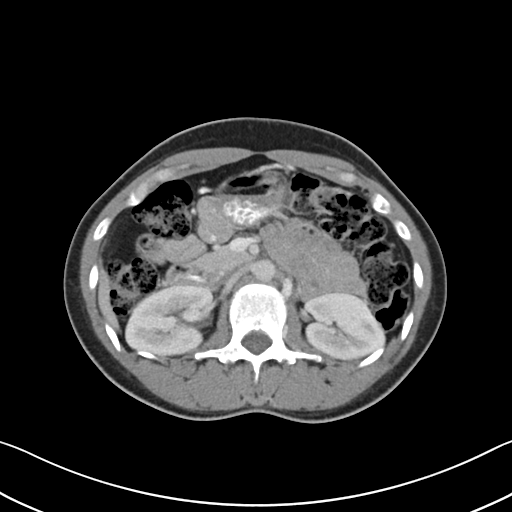
[im 63/91  bone]
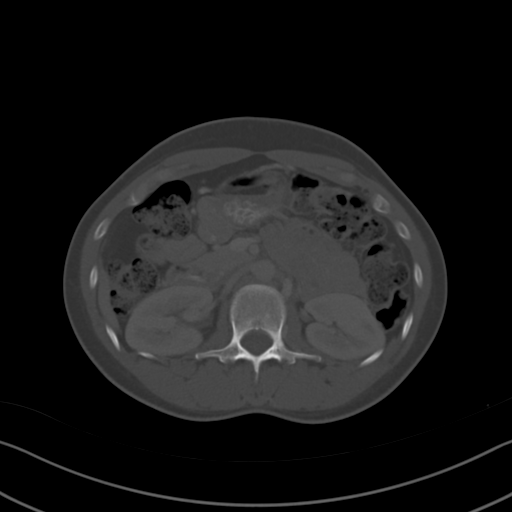
[im 70/91  soft-tissue]
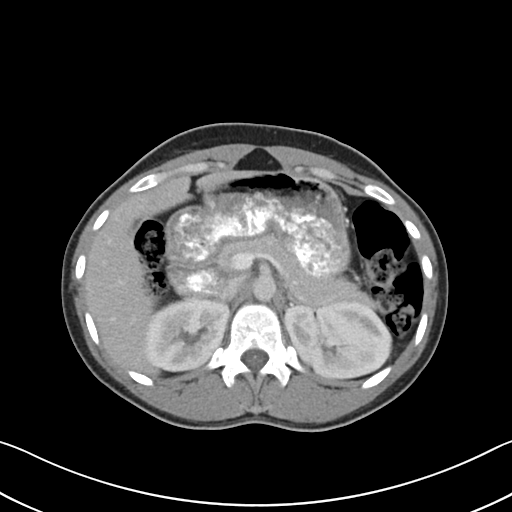
[im 77/91  soft-tissue]
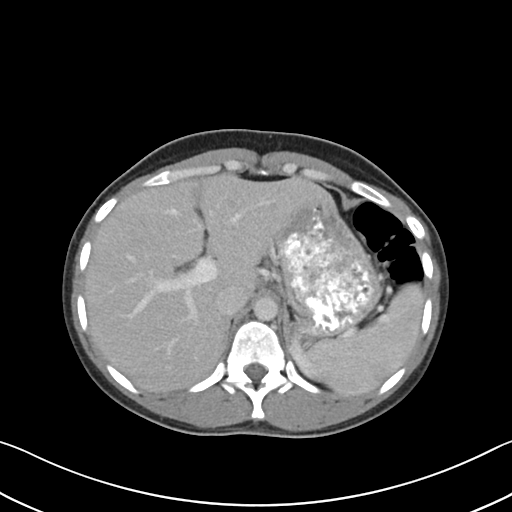
[im 84/91  soft-tissue]
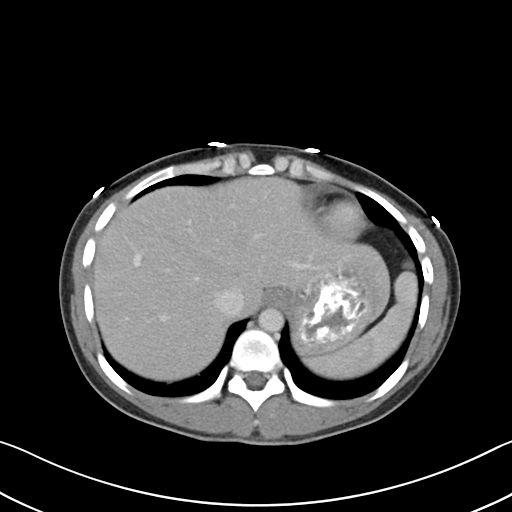

[Series 5: coronals · coronal · 0.63mm/px · 3 of 115 slices shown]
[im 39/115  soft-tissue]
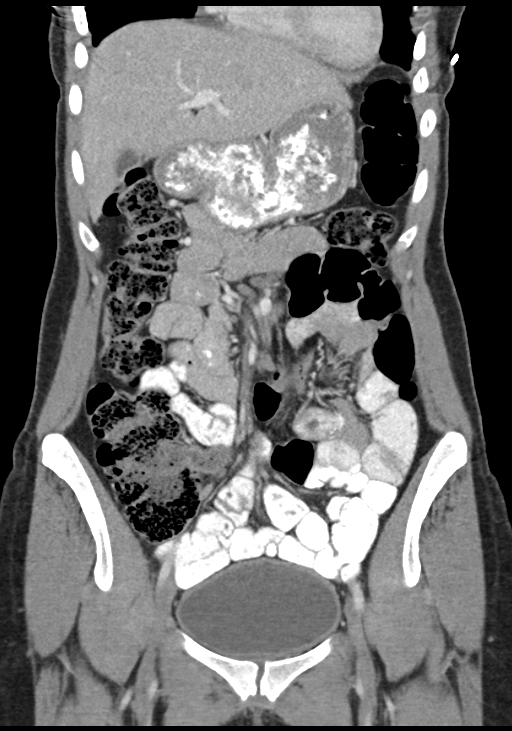
[im 51/115  soft-tissue]
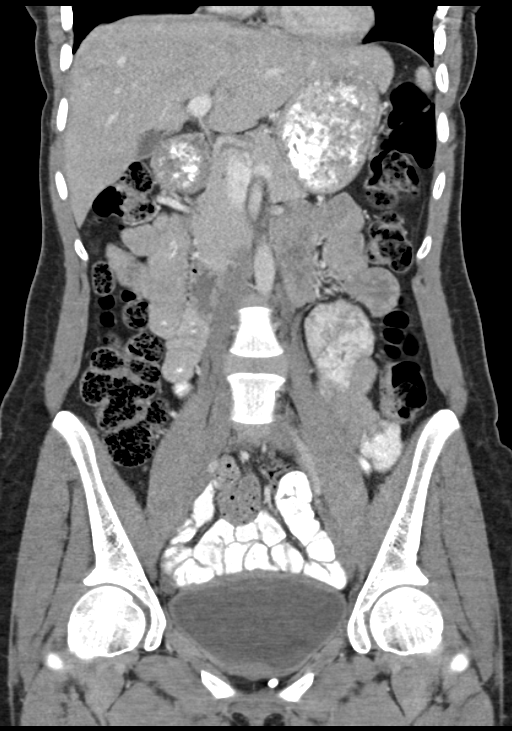
[im 64/115  soft-tissue]
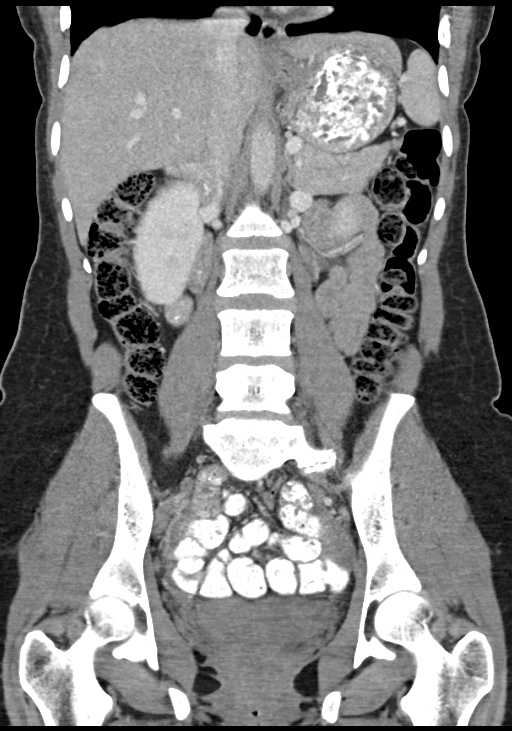

[15 of 46 positions shown; findings below may reference images not displayed]

FINDINGS: Lung bases are unremarkable. Enhanced liver is unremarkable.
Sagittal images of the spine are unremarkable. Gallbladder is
contracted without evidence of calcified gallstones. The pancreas,
spleen and adrenal glands are unremarkable. Kidneys are symmetrical
in size and enhancement. No hydronephrosis or hydroureter.

No small bowel obstruction. No aortic aneurysm. No ascites or free
air. No adenopathy. Normal retrocecal appendix partially visualized
in axial image 61. Abundant stool noted in right colon and cecum. No
pericecal inflammation. The terminal ileum is unremarkable. Moderate
stool is noted in transverse colon. Mild redundant transverse colon.
Moderate stool is noted in left colon. Some colonic gas noted in
proximal sigmoid colon without significant colonic distension.
Retroflexed uterus. The ovaries are unremarkable. No destructive
bony lesions are noted within pelvis. No pelvic ascites or
adenopathy. No inguinal adenopathy. Probable calcified post
injection granuloma subcutaneous in left gluteal region measures
cm.
IMPRESSION: 1. No acute inflammatory process within abdomen or pelvis.
2. Normal appendix. No pericecal inflammation. Abundant stool in
right colon and cecum. Moderate stool in transverse colon and
descending colon. Mild redundant transverse colon.
3. No small bowel obstruction.
4. No hydronephrosis or hydroureter.
5. Retroflexed uterus.

## 2015-10-04 ENCOUNTER — Ambulatory Visit (INDEPENDENT_AMBULATORY_CARE_PROVIDER_SITE_OTHER): Payer: 59

## 2015-10-04 ENCOUNTER — Encounter: Payer: Self-pay | Admitting: Urgent Care

## 2015-10-04 ENCOUNTER — Ambulatory Visit (INDEPENDENT_AMBULATORY_CARE_PROVIDER_SITE_OTHER): Payer: 59 | Admitting: Physician Assistant

## 2015-10-04 VITALS — BP 128/80 | HR 78 | Temp 98.5°F | Resp 17 | Ht 69.5 in | Wt 166.0 lb

## 2015-10-04 DIAGNOSIS — N912 Amenorrhea, unspecified: Secondary | ICD-10-CM | POA: Diagnosis not present

## 2015-10-04 DIAGNOSIS — R7611 Nonspecific reaction to tuberculin skin test without active tuberculosis: Secondary | ICD-10-CM

## 2015-10-04 LAB — POCT URINE PREGNANCY: PREG TEST UR: NEGATIVE

## 2015-10-04 NOTE — Progress Notes (Deleted)
   Madison Kent  MRN: [id number] DOB: [birth year]  Subjective:  Madison Kent is a 30 y.o. female seen in office today for a chief complaint of   Review of Systems  Patient Active Problem List   Diagnosis Date Noted  . Abdominal pain, chronic, right lower quadrant 07/05/2014  . Infertility management 04/20/2014  . Health care maintenance 04/20/2014  . Anemia 12/07/2013  . Irregular menses 12/07/2013    Current Outpatient Prescriptions on File Prior to Visit  Medication Sig Dispense Refill  . Multiple Vitamins-Minerals (HAIR/SKIN/NAILS PO) Place 2 tablets under the tongue daily.    . Vitamin D, Ergocalciferol, (DRISDOL) 50000 UNITS CAPS capsule Take 50,000 Units by mouth every 7 (seven) days.    . diclofenac (VOLTAREN) 75 MG EC tablet Take 1 tablet (75 mg total) by mouth 2 (two) times daily. (Patient not taking: Reported on 10/04/2015) 20 tablet 0  . ferrous fumarate (HEMOCYTE - 106 MG FE) 325 (106 FE) MG TABS tablet Take 1 tablet by mouth.    Marland Kitchen. omeprazole (PRILOSEC) 20 MG capsule Take 1 capsule (20 mg total) by mouth daily. (Patient not taking: Reported on 10/04/2015) 30 capsule 0  . oxyCODONE-acetaminophen (PERCOCET) 7.5-325 MG tablet Take 1 tablet by mouth every 4 (four) hours as needed for severe pain. (Patient not taking: Reported on 10/04/2015) 30 tablet 0   No current facility-administered medications on file prior to visit.     No Known Allergies  Objective:  BP 128/80 (BP Location: Right Arm, Patient Position: Sitting, Cuff Size: Normal)   Pulse 78   Temp 98.5 F (36.9 C) (Oral)   Resp 17   Ht 5' 9.5" (1.765 m)   Wt 166 lb (75.3 kg)   LMP 09/13/2015 (Approximate)   SpO2 100%   BMI 24.16 kg/m   Physical Exam  Assessment and Plan :   There are no diagnoses linked to this encounter.   Benjiman CoreBrittany Anis Cinelli PA-C  [facility] 10/04/2015 3:00 PM

## 2015-10-04 NOTE — Progress Notes (Deleted)
    MRN: [id number] DOB: [birth year]  Subjective:   Madison Kent is a 30 y.o. 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     Madison Kent has a current medication list which includes the following prescription(s): biotin w/ vitamins c & e, vitamin d (ergocalciferol), diclofenac, ferrous fumarate, omeprazole, and oxycodone-acetaminophen. Also has No Known Allergies.  Madison Kent  has a past medical history of Anemia; Headache; and Sinusitis nasal. Also  has a past surgical history that includes No past surgeries; Finger surgery; and Dilatation & currettage/hysteroscopy with versapoint resection (N/A, 01/22/2015).  Objective:   Vitals: BP 128/80 (BP Location: Right Arm, Patient Position: Sitting, Cuff Size: Normal)   Pulse 78   Temp 98.5 F (36.9 C) (Oral)   Resp 17   Ht 5' 9.5" (1.765 m)   Wt 166 lb (75.3 kg)   LMP 09/13/2015 (Approximate)   SpO2 100%   BMI 24.16 kg/m   Physical Exam  No results found.   Assessment and Plan :     Wallis BambergMario Shanna Un, PA-C [facility] [phone] 10/04/2015 2:53 PM

## 2015-10-04 NOTE — Progress Notes (Signed)
Madison Kent  MRN: [id number] DOB: [birth year]  Subjective:  Madison Kent is a 30 y.o. female seen in office today for a chief complaint of follow up on TB skin test. 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 She went back today for the reading was told it was positive and that she needed to see a physician. Pt is not having any current symptoms. 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 Has had at least four negative PPD skin tests in the past, all of which have been negative.   In our office, PPD skin test reading is 20mm.  Review of Systems  Constitutional: Negative for fever and unexpected weight change.       Negative for night sweats   Respiratory: Negative for cough and shortness of breath.        Negative for hemoptysis   Cardiovascular: Negative for chest pain and palpitations.  Gastrointestinal: Negative for diarrhea, nausea and vomiting.  Neurological: Negative for dizziness.    Patient Active Problem List   Diagnosis Date Noted  . Abdominal pain, chronic, right lower quadrant 07/05/2014  . Infertility management 04/20/2014  . Health care maintenance 04/20/2014  . Anemia 12/07/2013  . Irregular menses 12/07/2013    Current Outpatient Prescriptions on File Prior to Visit  Medication Sig Dispense Refill  . Multiple Vitamins-Minerals (HAIR/SKIN/NAILS PO) Place 2 tablets under the tongue daily.    . Vitamin D, Ergocalciferol, (DRISDOL) 50000 UNITS CAPS capsule Take 50,000 Units by mouth every 7 (seven) days.    . diclofenac (VOLTAREN) 75 MG EC tablet Take 1 tablet (75 mg total) by mouth 2 (two) times daily. (Patient not taking: Reported on 10/04/2015) 20 tablet 0  . ferrous fumarate (HEMOCYTE - 106 MG FE) 325 (106 FE) MG TABS tablet Take 1 tablet by mouth.    Marland Kitchen. omeprazole (PRILOSEC) 20 MG capsule Take 1 capsule (20 mg total) by mouth daily. (Patient not taking: Reported on 10/04/2015)  30 capsule 0  . oxyCODONE-acetaminophen (PERCOCET) 7.5-325 MG tablet Take 1 tablet by mouth every 4 (four) hours as needed for severe pain. (Patient not taking: Reported on 10/04/2015) 30 tablet 0   No current facility-administered medications on file prior to visit.     No Known Allergies  Objective:  BP 128/80 (BP Location: Right Arm, Patient Position: Sitting, Cuff Size: Normal)   Pulse 78   Temp 98.5 F (36.9 C) (Oral)   Resp 17   Ht 5' 9.5" (1.765 m)   Wt 166 lb (75.3 kg)   LMP 09/13/2015 (Approximate)   SpO2 100%   BMI 24.16 kg/m   Physical Exam  Constitutional: She is oriented to person, place, and time and well-developed, well-nourished, and in no distress.  HENT:  Head: Normocephalic and atraumatic.  Eyes: Conjunctivae are normal.  Neck: Normal range of motion.  Pulmonary/Chest: Effort normal.  Neurological: She is alert and oriented to person, place, and time. Gait normal.  Skin: Skin is warm and dry.  Psychiatric: Affect normal.  Vitals reviewed.    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  EXAM: CHEST 1 VIEW COMPARISON:  10/05/2014 FINDINGS: The heart size and mediastinal contours are within normal limits. Both lungs are clear. The visualized skeletal structures are unremarkable. IMPRESSION: No active disease. Electronically Signed   By: Kennith Center M.D.   On: 10/04/2015 16:05     Assessment and Plan :  1. Positive PPD -Pt to follow up with Awilda Metro at [facility] Department - DG Chest 1 View; Future  2. Amenorrhea - POCT urine pregnancy    Benjiman Core PA-C  [facility] 10/04/2015 4:10 PM

## 2015-10-04 NOTE — Progress Notes (Signed)
  Tuberculosis Risk Questionnaire  1. Yes ([place], [place]) Were you born outside the [place] in one of the following parts of the world: [place], [place], [place], [place] or [place]?    2. Yes (Moved from [place] 4 years ago) Have you traveled outside the [place] and lived for more than one month in one of the following parts of the world: [place], [place], [place], [place] or [place]?    3. No Do you have a compromised immune system such as from any of the following conditions:HIV/AIDS, organ or bone marrow transplantation, diabetes, immunosuppressive medicines (e.g. Prednisone, Remicaide), leukemia, lymphoma, cancer of the head or neck, gastrectomy or jejunal bypass, end-stage renal disease (on dialysis), or silicosis?     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    5. No Have you ever: injected illegal drugs, used crack cocaine, lived in a homeless shelter  or been in jail or prison?     6. No Have you ever been exposed to anyone with infectious tuberculosis?    Tuberculosis Symptom Questionnaire  Do you currently have any of the following symptoms?  1. No Unexplained cough lasting more than 3 weeks?   2. No Unexplained fever lasting more than 3 weeks.   3. No Night Sweats (sweating that leaves the bedclothes and sheets wet)     4. No Shortness of Breath   5. No Chest Pain   6. No Unintentional weight loss    7. Yes (for 2-3 years) Unexplained fatigue (very tired for no reason)

## 2015-10-04 NOTE — Patient Instructions (Addendum)
  Call Tammy Faucette at [phone], she should be able to get you an appointment next week.    IF you received an x-ray today, you will receive an invoice from [facility]. Please contact [facility] at [phone] with questions or concerns regarding your invoice.   IF you received labwork today, you will receive an invoice from [facility]. Please contact [facility] at [phone] with questions or concerns regarding your invoice.   Our billing staff will not be able to assist you with questions regarding bills from these companies.  You will be contacted with the lab results as soon as they are available. The fastest way to get your results is to activate your My Chart account. Instructions are located on the last page of this paperwork. If you have not heard from us regarding the results in 2 weeks, please contact this office.

## 2015-12-23 ENCOUNTER — Telehealth: Payer: Self-pay | Admitting: Family Medicine

## 2015-12-23 NOTE — Telephone Encounter (Signed)
Pt came into office want a copy of her xray report I gave her copy

## 2016-06-22 ENCOUNTER — Ambulatory Visit (INDEPENDENT_AMBULATORY_CARE_PROVIDER_SITE_OTHER): Payer: 59 | Admitting: Physician Assistant

## 2016-06-22 ENCOUNTER — Encounter: Payer: Self-pay | Admitting: Physician Assistant

## 2016-06-22 VITALS — BP 118/74 | HR 79 | Temp 98.5°F | Resp 18 | Ht 69.69 in | Wt 166.8 lb

## 2016-06-22 DIAGNOSIS — Z13228 Encounter for screening for other metabolic disorders: Secondary | ICD-10-CM | POA: Diagnosis not present

## 2016-06-22 DIAGNOSIS — N926 Irregular menstruation, unspecified: Secondary | ICD-10-CM

## 2016-06-22 DIAGNOSIS — Z130 Encounter for screening for diseases of the blood and blood-forming organs and certain disorders involving the immune mechanism: Secondary | ICD-10-CM

## 2016-06-22 DIAGNOSIS — Z8639 Personal history of other endocrine, nutritional and metabolic disease: Secondary | ICD-10-CM | POA: Diagnosis not present

## 2016-06-22 DIAGNOSIS — Z23 Encounter for immunization: Secondary | ICD-10-CM | POA: Diagnosis not present

## 2016-06-22 DIAGNOSIS — Z862 Personal history of diseases of the blood and blood-forming organs and certain disorders involving the immune mechanism: Secondary | ICD-10-CM

## 2016-06-22 DIAGNOSIS — Z1329 Encounter for screening for other suspected endocrine disorder: Secondary | ICD-10-CM | POA: Diagnosis not present

## 2016-06-22 DIAGNOSIS — Z0000 Encounter for general adult medical examination without abnormal findings: Secondary | ICD-10-CM

## 2016-06-22 DIAGNOSIS — Z13220 Encounter for screening for lipoid disorders: Secondary | ICD-10-CM

## 2016-06-22 LAB — POCT URINE PREGNANCY: PREG TEST UR: NEGATIVE

## 2016-06-22 NOTE — Patient Instructions (Addendum)
Thank you for letting me participate in your health and well being.   For intermittent itching, I would like you to use either oral benedryl or OTC hydrocortisone cream. Do no use the cream near your eyes and only use for one week at a time as it can lighten your skin.   For back pain, please begin back stretches daily. Get a massage if you can. Use heat to affected area as needed for 20 minutes at a time.   We will contact you in the next week with your results. If you do not have a period in the next 2 weeks, please return.    Health Maintenance, Female Adopting a healthy lifestyle and getting preventive care can go a long way to promote health and wellness. Talk with your health care provider about what schedule of regular examinations is right for you. This is a good chance for you to check in with your provider about disease prevention and staying healthy. In between checkups, there are plenty of things you can do on your own. Experts have done a lot of research about which lifestyle changes and preventive measures are most likely to keep you healthy. Ask your health care provider for more information. Weight and diet Eat a healthy diet  Be sure to include plenty of vegetables, fruits, low-fat dairy products, and lean protein.  Do not eat a lot of foods high in solid fats, added sugars, or salt.  Get regular exercise. This is one of the most important things you can do for your health.  Most adults should exercise for at least 150 minutes each week. The exercise should increase your heart rate and make you sweat (moderate-intensity exercise).  Most adults should also do strengthening exercises at least twice a week. This is in addition to the moderate-intensity exercise. Maintain a healthy weight  Body mass index (BMI) is a measurement that can be used to identify possible weight problems. It estimates body fat based on height and weight. Your health care provider can help determine  your BMI and help you achieve or maintain a healthy weight.  For females 55 years of age and older:  A BMI below 18.5 is considered underweight.  A BMI of 18.5 to 24.9 is normal.  A BMI of 25 to 29.9 is considered overweight.  A BMI of 30 and above is considered obese. Watch levels of cholesterol and blood lipids  You should start having your blood tested for lipids and cholesterol at 29 years of age, then have this test every 5 years.  You may need to have your cholesterol levels checked more often if:  Your lipid or cholesterol levels are high.  You are older than 29 years of age.  You are at high risk for heart disease. Cancer screening Lung Cancer  Lung cancer screening is recommended for adults 53-77 years old who are at high risk for lung cancer because of a history of smoking.  A yearly low-dose CT scan of the lungs is recommended for people who:  Currently smoke.  Have quit within the past 15 years.  Have at least a 30-pack-year history of smoking. A pack year is smoking an average of one pack of cigarettes a day for 1 year.  Yearly screening should continue until it has been 15 years since you quit.  Yearly screening should stop if you develop a health problem that would prevent you from having lung cancer treatment. Breast Cancer  Practice breast self-awareness. This means  understanding how your breasts normally appear and feel.  It also means doing regular breast self-exams. Let your health care provider know about any changes, no matter how small.  If you are in your 20s or 30s, you should have a clinical breast exam (CBE) by a health care provider every 1-3 years as part of a regular health exam.  If you are 81 or older, have a CBE every year. Also consider having a breast X-ray (mammogram) every year.  If you have a family history of breast cancer, talk to your health care provider about genetic screening.  If you are at high risk for breast cancer,  talk to your health care provider about having an MRI and a mammogram every year.  Breast cancer gene (BRCA) assessment is recommended for women who have family members with BRCA-related cancers. BRCA-related cancers include:  Breast.  Ovarian.  Tubal.  Peritoneal cancers.  Results of the assessment will determine the need for genetic counseling and BRCA1 and BRCA2 testing. Cervical Cancer  Your health care provider may recommend that you be screened regularly for cancer of the pelvic organs (ovaries, uterus, and vagina). This screening involves a pelvic examination, including checking for microscopic changes to the surface of your cervix (Pap test). You may be encouraged to have this screening done every 3 years, beginning at age 56.  For women ages 34-65, health care providers may recommend pelvic exams and Pap testing every 3 years, or they may recommend the Pap and pelvic exam, combined with testing for human papilloma virus (HPV), every 5 years. Some types of HPV increase your risk of cervical cancer. Testing for HPV may also be done on women of any age with unclear Pap test results.  Other health care providers may not recommend any screening for nonpregnant women who are considered low risk for pelvic cancer and who do not have symptoms. Ask your health care provider if a screening pelvic exam is right for you.  If you have had past treatment for cervical cancer or a condition that could lead to cancer, you need Pap tests and screening for cancer for at least 20 years after your treatment. If Pap tests have been discontinued, your risk factors (such as having a new sexual partner) need to be reassessed to determine if screening should resume. Some women have medical problems that increase the chance of getting cervical cancer. In these cases, your health care provider may recommend more frequent screening and Pap tests. Colorectal Cancer  This type of cancer can be detected and often  prevented.  Routine colorectal cancer screening usually begins at 29 years of age and continues through 29 years of age.  Your health care provider may recommend screening at an earlier age if you have risk factors for colon cancer.  Your health care provider may also recommend using home test kits to check for hidden blood in the stool.  A small camera at the end of a tube can be used to examine your colon directly (sigmoidoscopy or colonoscopy). This is done to check for the earliest forms of colorectal cancer.  Routine screening usually begins at age 36.  Direct examination of the colon should be repeated every 5-10 years through 29 years of age. However, you may need to be screened more often if early forms of precancerous polyps or small growths are found. Skin Cancer  Check your skin from head to toe regularly.  Tell your health care provider about any new moles or changes  in moles, especially if there is a change in a mole's shape or color.  Also tell your health care provider if you have a mole that is larger than the size of a pencil eraser.  Always use sunscreen. Apply sunscreen liberally and repeatedly throughout the day.  Protect yourself by wearing long sleeves, pants, a wide-brimmed hat, and sunglasses whenever you are outside. Heart disease, diabetes, and high blood pressure  High blood pressure causes heart disease and increases the risk of stroke. High blood pressure is more likely to develop in:  People who have blood pressure in the high end of the normal range (130-139/85-89 mm Hg).  People who are overweight or obese.  People who are African American.  If you are 93-18 years of age, have your blood pressure checked every 3-5 years. If you are 57 years of age or older, have your blood pressure checked every year. You should have your blood pressure measured twice-once when you are at a hospital or clinic, and once when you are not at a hospital or clinic. Record  the average of the two measurements. To check your blood pressure when you are not at a hospital or clinic, you can use:  An automated blood pressure machine at a pharmacy.  A home blood pressure monitor.  If you are between 44 years and 14 years old, ask your health care provider if you should take aspirin to prevent strokes.  Have regular diabetes screenings. This involves taking a blood sample to check your fasting blood sugar level.  If you are at a normal weight and have a low risk for diabetes, have this test once every three years after 29 years of age.  If you are overweight and have a high risk for diabetes, consider being tested at a younger age or more often. Preventing infection Hepatitis B  If you have a higher risk for hepatitis B, you should be screened for this virus. You are considered at high risk for hepatitis B if:  You were born in a country where hepatitis B is common. Ask your health care provider which countries are considered high risk.  Your parents were born in a high-risk country, and you have not been immunized against hepatitis B (hepatitis B vaccine).  You have HIV or AIDS.  You use needles to inject street drugs.  You live with someone who has hepatitis B.  You have had sex with someone who has hepatitis B.  You get hemodialysis treatment.  You take certain medicines for conditions, including cancer, organ transplantation, and autoimmune conditions. Hepatitis C  Blood testing is recommended for:  Everyone born from [birth year] through 1963.  Anyone with known risk factors for hepatitis C. Sexually transmitted infections (STIs)  You should be screened for sexually transmitted infections (STIs) including gonorrhea and chlamydia if:  You are sexually active and are younger than 29 years of age.  You are older than 29 years of age and your health care provider tells you that you are at risk for this type of infection.  Your sexual activity has  changed since you were last screened and you are at an increased risk for chlamydia or gonorrhea. Ask your health care provider if you are at risk.  If you do not have HIV, but are at risk, it may be recommended that you take a prescription medicine daily to prevent HIV infection. This is called pre-exposure prophylaxis (PrEP). You are considered at risk if:  You are sexually active  and do not regularly use condoms or know the HIV status of your partner(s).  You take drugs by injection.  You are sexually active with a partner who has HIV. Talk with your health care provider about whether you are at high risk of being infected with HIV. If you choose to begin PrEP, you should first be tested for HIV. You should then be tested every 3 months for as long as you are taking PrEP. Pregnancy  If you are premenopausal and you may become pregnant, ask your health care provider about preconception counseling.  If you may become pregnant, take 400 to 800 micrograms (mcg) of folic acid every day.  If you want to prevent pregnancy, talk to your health care provider about birth control (contraception). Osteoporosis and menopause  Osteoporosis is a disease in which the bones lose minerals and strength with aging. This can result in serious bone fractures. Your risk for osteoporosis can be identified using a bone density scan.  If you are 70 years of age or older, or if you are at risk for osteoporosis and fractures, ask your health care provider if you should be screened.  Ask your health care provider whether you should take a calcium or vitamin D supplement to lower your risk for osteoporosis.  Menopause may have certain physical symptoms and risks.  Hormone replacement therapy may reduce some of these symptoms and risks. Talk to your health care provider about whether hormone replacement therapy is right for you. Follow these instructions at home:  Schedule regular health, dental, and eye  exams.  Stay current with your immunizations.  Do not use any tobacco products including cigarettes, chewing tobacco, or electronic cigarettes.  If you are pregnant, do not drink alcohol.  If you are breastfeeding, limit how much and how often you drink alcohol.  Limit alcohol intake to no more than 1 drink per day for nonpregnant women. One drink equals 12 ounces of beer, 5 ounces of wine, or 1 ounces of hard liquor.  Do not use street drugs.  Do not share needles.  Ask your health care provider for help if you need support or information about quitting drugs.  Tell your health care provider if you often feel depressed.  Tell your health care provider if you have ever been abused or do not feel safe at home. This information is not intended to replace advice given to you by your health care provider. Make sure you discuss any questions you have with your health care provider. Document Released: 08/11/2010 Document Revised: 07/04/2015 Document Reviewed: 10/30/2014 [employer]   Please perform exercises below. Stretches are to be performed for 2 sets, holding 10-15 seconds each. Recommended to perform this rehab twice daily within pain tolerance for 2 weeks.   FLEXION RANGE OF MOTION AND STRETCHING EXERCISES: STRETCH - Flexion, Single Knee to Chest   Lie on a firm bed or floor with both legs extended in front of you.  Keeping one leg in contact with the floor, bring your opposite knee to your chest. Hold your leg in place by either grabbing behind your thigh or at your knee.  Pull until you feel a gentle stretch in your lower back.   Slowly release your grasp and repeat the exercise with the opposite side.  STRETCH - Flexion, Double Knee to Chest   Lie on a firm bed or floor with both legs extended in front of you.  Keeping one leg in contact with the floor, bring your  opposite knee to your chest.  Tense your stomach muscles to support your back and then lift your  other knee to your chest. Hold your legs in place by either grabbing behind your thighs or at your knees.  Pull both knees toward your chest until you feel a gentle stretch in your lower back.   Tense your stomach muscles and slowly return one leg at a time to the floor.  STRETCH - Low Trunk Rotation  Lie on a firm bed or floor. Keeping your legs in front of you, bend your knees so they are both pointed toward the ceiling and your feet are flat on the floor.  Extend your arms out to the side. This will stabilize your upper body by keeping your shoulders in contact with the floor.  Gently and slowly drop both knees together to one side until you feel a gentle stretch in your lower back.   Tense your stomach muscles to support your lower back as you bring your knees back to the starting position. Repeat the exercise to the other side.   EXTENSION RANGE OF MOTION AND FLEXIBILITY EXERCISES: STRETCH - Extension, Prone on Elbows   Lie on your stomach on the floor, a bed will be too soft. Place your palms about shoulder width apart and at the height of your head.  Place your elbows under your shoulders. If this is too painful, stack pillows under your chest.  Allow your body to relax so that your hips drop lower and make contact more completely with the floor.  Slowly return to lying flat on the floor.  RANGE OF MOTION - Extension, Prone Press Ups  Lie on your stomach on the floor, a bed will be too soft. Place your palms about shoulder width apart and at the height of your head.  Keeping your back as relaxed as possible, slowly straighten your elbows while keeping your hips on the floor. You may adjust the placement of your hands to maximize your comfort. As you gain motion, your hands will come more underneath your shoulders.  Slowly return to lying flat on the floor.  RANGE OF MOTION- Quadruped, Neutral Spine   Assume a hands and knees position on a firm surface. Keep your hands  under your shoulders and your knees under your hips. You may place padding under your knees for comfort.  Drop your head and point your tail bone toward the ground below you. This will round out your lower back like an angry cat.    Slowly lift your head and release your tail bone so that your back sags into a large arch, like an old horse.  Repeat this until you feel limber in your lower back.  Now, find your "sweet spot." This will be the most comfortable position somewhere between the two previous positions. This is your neutral spine. Once you have found this position, tense your stomach muscles to support your lower back.  STRENGTHENING EXERCISES - Low Back Strain These exercises may help you when beginning to rehabilitate your injury. These exercises should be done near your "sweet spot." This is the neutral, low-back arch, somewhere between fully rounded and fully arched, that is your least painful position. When performed in this safe range of motion, these exercises can be used for people who have either a flexion or extension based injury. These exercises may resolve your symptoms with or without further involvement from your physician, physical therapist or athletic trainer. While completing these exercises, remember:  Muscles can gain both the endurance and the strength needed for everyday activities through controlled exercises.  Complete these exercises as instructed by your physician, physical therapist or athletic trainer. Increase the resistance and repetitions only as guided.  You may experience muscle soreness or fatigue, but the pain or discomfort you are trying to eliminate should never worsen during these exercises. If this pain does worsen, stop and make certain you are following the directions exactly. If the pain is still present after adjustments, discontinue the exercise until you can discuss the trouble with your caregiver.  STRENGTHENING - Deep Abdominals, Pelvic  Tilt  Lie on a firm bed or floor. Keeping your legs in front of you, bend your knees so they are both pointed toward the ceiling and your feet are flat on the floor.  Tense your lower abdominal muscles to press your lower back into the floor. This motion will rotate your pelvis so that your tail bone is scooping upwards rather than pointing at your feet or into the floor.  STRENGTHENING - Abdominals, Crunches   Lie on a firm bed or floor. Keeping your legs in front of you, bend your knees so they are both pointed toward the ceiling and your feet are flat on the floor. Cross your arms over your chest.  Slightly tip your chin down without bending your neck.  Tense your abdominals and slowly lift your trunk high enough to just clear your shoulder blades. Lifting higher can put excessive stress on the lower back and does not further strengthen your abdominal muscles.  Control your return to the starting position.  STRENGTHENING - Quadruped, Opposite UE/LE Lift   Assume a hands and knees position on a firm surface. Keep your hands under your shoulders and your knees under your hips. You may place padding under your knees for comfort.  Find your neutral spine and gently tense your abdominal muscles so that you can maintain this position. Your shoulders and hips should form a rectangle that is parallel with the floor and is not twisted.  Keeping your trunk steady, lift your right hand no higher than your shoulder and then your left leg no higher than your hip. Make sure you are not holding your breath.   Continuing to keep your abdominal muscles tense and your back steady, slowly return to your starting position. Repeat with the opposite arm and leg.  STRENGTHENING - Lower Abdominals, Double Knee Lift  Lie on a firm bed or floor. Keeping your legs in front of you, bend your knees so they are both pointed toward the ceiling and your feet are flat on the floor.  Tense your abdominal muscles to  brace your lower back and slowly lift both of your knees until they come over your hips. Be certain not to hold your breath.  POSTURE AND BODY MECHANICS CONSIDERATIONS - Low Back Strain Keeping correct posture when sitting, standing or completing your activities will reduce the stress put on different body tissues, allowing injured tissues a chance to heal and limiting painful experiences. The following are general guidelines for improved posture. Your physician or physical therapist will provide you with any instructions specific to your needs. While reading these guidelines, remember:  The exercises prescribed by your provider will help you have the flexibility and strength to maintain correct postures.  The correct posture provides the best environment for your joints to work. All of your joints have less wear and tear when properly supported by a spine with good  posture. This means you will experience a healthier, less painful body.  Correct posture must be practiced with all of your activities, especially prolonged sitting and standing. Correct posture is as important when doing repetitive low-stress activities (typing) as it is when doing a single heavy-load activity (lifting). RESTING POSITIONS Consider which positions are most painful for you when choosing a resting position. If you have pain with flexion-based activities (sitting, bending, stooping, squatting), choose a position that allows you to rest in a less flexed posture. You would want to avoid curling into a fetal position on your side. If your pain worsens with extension-based activities (prolonged standing, working overhead), avoid resting in an extended position such as sleeping on your stomach. Most people will find more comfort when they rest with their spine in a more neutral position, neither too rounded nor too arched. Lying on a non-sagging bed on your side with a pillow between your knees, or on your back with a pillow under  your knees will often provide some relief. Keep in mind, being in any one position for a prolonged period of time, no matter how correct your posture, can still lead to stiffness. PROPER SITTING POSTURE In order to minimize stress and discomfort on your spine, you must sit with correct posture. Sitting with good posture should be effortless for a healthy body. Returning to good posture is a gradual process. Many people can work toward this most comfortably by using various supports until they have the flexibility and strength to maintain this posture on their own. When sitting with proper posture, your ears will fall over your shoulders and your shoulders will fall over your hips. You should use the back of the chair to support your upper back. Your lower back will be in a neutral position, just slightly arched. You may place a small pillow or folded towel at the base of your lower back for support.  When working at a desk, create an environment that supports good, upright posture. Without extra support, muscles tire, which leads to excessive strain on joints and other tissues. Keep these recommendations in mind: CHAIR:  A chair should be able to slide under your desk when your back makes contact with the back of the chair. This allows you to work closely.  The chair's height should allow your eyes to be level with the upper part of your monitor and your hands to be slightly lower than your elbows. BODY POSITION  Your feet should make contact with the floor. If this is not possible, use a foot rest.  Keep your ears over your shoulders. This will reduce stress on your neck and lower back. INCORRECT SITTING POSTURES  If you are feeling tired and unable to assume a healthy sitting posture, do not slouch or slump. This puts excessive strain on your back tissues, causing more damage and pain. Healthier options include:  Using more support, like a lumbar pillow.  Switching tasks to something that  requires you to be upright or walking.  Talking a brief walk.  Lying down to rest in a neutral-spine position. PROLONGED STANDING WHILE SLIGHTLY LEANING FORWARD  When completing a task that requires you to lean forward while standing in one place for a long time, place either foot up on a stationary 2-4 inch high object to help maintain the best posture. When both feet are on the ground, the lower back tends to lose its slight inward curve. If this curve flattens (or becomes too large), then the  back and your other joints will experience too much stress, tire more quickly, and can cause pain. CORRECT STANDING POSTURES Proper standing posture should be assumed with all daily activities, even if they only take a few moments, like when brushing your teeth. As in sitting, your ears should fall over your shoulders and your shoulders should fall over your hips. You should keep a slight tension in your abdominal muscles to brace your spine. Your tailbone should point down to the ground, not behind your body, resulting in an over-extended swayback posture.  INCORRECT STANDING POSTURES  Common incorrect standing postures include a forward head, locked knees and/or an excessive swayback. WALKING Walk with an upright posture. Your ears, shoulders and hips should all line-up. PROLONGED ACTIVITY IN A FLEXED POSITION When completing a task that requires you to bend forward at your waist or lean over a low surface, try to find a way to stabilize 3 out of 4 of your limbs. You can place a hand or elbow on your thigh or rest a knee on the surface you are reaching across. This will provide you more stability so that your muscles do not fatigue as quickly. By keeping your knees relaxed, or slightly bent, you will also reduce stress across your lower back. CORRECT LIFTING TECHNIQUES DO :   Assume a wide stance. This will provide you more stability and the opportunity to get as close as possible to the object which you  are lifting.  Tense your abdominals to brace your spine. Bend at the knees and hips. Keeping your back locked in a neutral-spine position, lift using your leg muscles. Lift with your legs, keeping your back straight.  Test the weight of unknown objects before attempting to lift them.  Try to keep your elbows locked down at your sides in order get the best strength from your shoulders when carrying an object.  Always ask for help when lifting heavy or awkward objects. INCORRECT LIFTING TECHNIQUES DO NOT:   Lock your knees when lifting, even if it is a small object.  Bend and twist. Pivot at your feet or move your feet when needing to change directions.  Assume that you can safely pick up even a paper clip without proper posture.     IF you received an x-ray today, you will receive an invoice from [facility]. Please contact [facility] at [phone] with questions or concerns regarding your invoice.   IF you received labwork today, you will receive an invoice from [place]. Please contact [facility] at [phone] with questions or concerns regarding your invoice.   Our billing staff will not be able to assist you with questions regarding bills from these companies.  You will be contacted with the lab results as soon as they are available. The fastest way to get your results is to activate your My Chart account. Instructions are located on the last page of this paperwork. If you have not heard from [place] regarding the results in 2 weeks, please contact this office.

## 2016-06-22 NOTE — Progress Notes (Signed)
Madison Kent  MRN: [id number] DOB: 1986/01/27  Subjective:  Pt is a 31 y.o. female who presents for annual physical exam. Pt is not fasting today. She ate noodles and pancakes.   Social: Pt is from [place], has been in [place] 3 years now. She is married. Works at [employer].   Diet: Eats mostly african food: beef, chicken, rice, vegetables, and fruits. Drinks mostly water.   Exercise: Does stretching and walks sometimes.   Sleep: Gets about 4-5 hours a night. Does feel tired throughout the day.   Menstrual cycles: Typically regular, monthly. LMP 05/12/16, she is actively trying to get pregnant. She is on a prenatal vitamin. She is seen by [facility].  Last dental exam: < 1 year ago Last vision exam:  Never Last pap smear: 05/03/2014  Vaccinations      Tetanus: >10 years ago        Patient Active Problem List   Diagnosis Date Noted  . Abdominal pain, chronic, right lower quadrant 07/05/2014  . Infertility management 04/20/2014  . Health care maintenance 04/20/2014  . Anemia 12/07/2013  . Irregular menses 12/07/2013    Current Outpatient Prescriptions on File Prior to Visit  Medication Sig Dispense Refill  . Vitamin D, Ergocalciferol, (DRISDOL) 50000 UNITS CAPS capsule Take 50,000 Units by mouth every 7 (seven) days.    . diclofenac (VOLTAREN) 75 MG EC tablet Take 1 tablet (75 mg total) by mouth 2 (two) times daily. (Patient not taking: Reported on 10/04/2015) 20 tablet 0  . ferrous fumarate (HEMOCYTE - 106 MG FE) 325 (106 FE) MG TABS tablet Take 1 tablet by mouth.    . Multiple Vitamins-Minerals (HAIR/SKIN/NAILS PO) Place 2 tablets under the tongue daily.    Marland Kitchen omeprazole (PRILOSEC) 20 MG capsule Take 1 capsule (20 mg total) by mouth daily. (Patient not taking: Reported on 10/04/2015) 30 capsule 0  . oxyCODONE-acetaminophen (PERCOCET) 7.5-325 MG tablet Take 1 tablet by mouth every 4 (four) hours as needed for severe pain. (Patient not taking: Reported on 10/04/2015) 30 tablet 0    No current facility-administered medications on file prior to visit.     No Known Allergies  Social History   Social History  . Marital status: Single    Spouse name: N/A  . Number of children: N/A  . Years of education: N/A   Social History Main Topics  . Smoking status: Never Smoker  . Smokeless tobacco: Never Used  . Alcohol use No  . Drug use: No  . Sexual activity: Yes    Birth control/ protection: None   Other Topics Concern  . None   Social History Narrative  . None    Past Surgical History:  Procedure Laterality Date  . DILITATION & CURRETTAGE/HYSTROSCOPY WITH VERSAPOINT RESECTION N/A 01/22/2015   Procedure: DILATATION & CURETTAGE/HYSTEROSCOPY WITH VERSAPOINT RESECTION;  Surgeon: Princess Bruins, MD;  Location: [facility];  Service: Gynecology;  Laterality: N/A;  . FINGER SURGERY    . NO PAST SURGERIES      Family History  Problem Relation Age of Onset  . Cancer Father 71       Liver cancer- Hepatitis B  . Diabetes Father   . Hypertension Father   . Colon cancer Neg Hx     Review of Systems  Constitutional: Positive for fatigue. Negative for activity change, appetite change, chills, diaphoresis, fever and unexpected weight change.  HENT: Positive for sinus pressure. Negative for congestion, dental problem, drooling, ear discharge, ear pain, facial swelling,  hearing loss, mouth sores, nosebleeds, postnasal drip, rhinorrhea, sinus pain, sneezing, sore throat, tinnitus, trouble swallowing and voice change.   Eyes: Negative for photophobia, pain, discharge, redness, itching and visual disturbance.  Respiratory: Negative for apnea, cough, choking, chest tightness, shortness of breath, wheezing and stridor.   Cardiovascular: Negative for chest pain, palpitations and leg swelling.  Gastrointestinal: Negative for abdominal distention, abdominal pain, anal bleeding, blood in stool, constipation, diarrhea, nausea, rectal pain and vomiting.  Endocrine: Negative for  cold intolerance, heat intolerance, polydipsia, polyphagia and polyuria.  Genitourinary: Negative for decreased urine volume, difficulty urinating, dyspareunia, dysuria, enuresis, flank pain, frequency, genital sores, hematuria, menstrual problem, pelvic pain, urgency, vaginal bleeding, vaginal discharge and vaginal pain.  Musculoskeletal: Positive for back pain (will have intermittent thoracic back pain after working long hours, not present today). Negative for arthralgias, gait problem, joint swelling, myalgias, neck pain and neck stiffness.  Skin: Negative for color change, pallor, rash and wound.  Allergic/Immunologic: Negative for environmental allergies, food allergies and immunocompromised state.  Neurological: Positive for headaches. Negative for dizziness, tremors, seizures, syncope, facial asymmetry, speech difficulty, weakness, light-headedness and numbness.  Hematological: Negative for adenopathy. Does not bruise/bleed easily.  Psychiatric/Behavioral: Negative for agitation, behavioral problems, confusion, decreased concentration, dysphoric mood, hallucinations, self-injury, sleep disturbance and suicidal ideas. The patient is not nervous/anxious and is not hyperactive.     Objective:  BP 118/74   Pulse 79   Temp 98.5 F (36.9 C) (Oral)   Resp 18   Ht 5' 9.69" (1.77 m)   Wt 166 lb 12.8 oz (75.7 kg)   LMP 05/12/2016   SpO2 99%   BMI 24.15 kg/m   Physical Exam  Constitutional: She is oriented to person, place, and time and well-developed, well-nourished, and in no distress.  HENT:  Head: Normocephalic and atraumatic.  Right Ear: Hearing, tympanic membrane, external ear and ear canal normal.  Left Ear: Hearing, tympanic membrane, external ear and ear canal normal.  Nose: Nose normal.  Mouth/Throat: Uvula is midline, oropharynx is clear and moist and mucous membranes are normal. No oropharyngeal exudate.  Eyes: Conjunctivae, EOM and lids are normal. Pupils are equal, round, and  reactive to light. No scleral icterus.  Neck: Trachea normal and normal range of motion. No thyroid mass and no thyromegaly present.  Cardiovascular: Normal rate, regular rhythm, normal heart sounds and intact distal pulses.   Pulmonary/Chest: Effort normal and breath sounds normal.  Abdominal: Soft. Normal appearance and bowel sounds are normal. There is no tenderness.  Musculoskeletal: Normal range of motion.  Lymphadenopathy:       Head (right side): No tonsillar, no preauricular, no posterior auricular and no occipital adenopathy present.       Head (left side): No tonsillar, no preauricular, no posterior auricular and no occipital adenopathy present.    She has no cervical adenopathy.       Right: No supraclavicular adenopathy present.       Left: No supraclavicular adenopathy present.  Neurological: She is alert and oriented to person, place, and time. She has normal sensation, normal strength and normal reflexes. Gait normal.  Skin: Skin is warm and dry.  Psychiatric: Affect normal.       Visual Acuity Screening   Right eye Left eye Both eyes  Without correction: 20/15-2 20/20 20/13  With correction:       Results for orders placed or performed in visit on 06/22/16 (from the past 24 hour(s))  POCT urine pregnancy     Status: None  Collection Time: 06/22/16 11:01 AM  Result Value Ref Range   Preg Test, Ur Negative Negative    Assessment and Plan :  Discussed healthy lifestyle, diet, exercise, preventative care, vaccinations, and addressed patient's concerns. Plan for follow up in one year. Otherwise, plan for specific conditions below.  1. Annual physical exam Await lab results. Pt would like lab results mailed to her.   2. Screening, anemia, deficiency, iron - CBC with Differential/Platelet  3. Screening for metabolic disorder - FQH22+VJDY  4. Screening for thyroid disorder - TSH  5. Missed period -Neg pregnancy test if office.  - POCT urine pregnancy  6.  Screening, lipid - Lipid panel  7. History of vitamin D deficiency - VITAMIN D 25 Hydroxy (Vit-D Deficiency, Fractures)  8. History of anemia - Sickle cell screen - Pathologist smear review  Tenna Delaine, PA-C  [facility] 06/22/2016 8:00 PM

## 2016-06-23 LAB — LIPID PANEL
CHOLESTEROL TOTAL: 130 mg/dL (ref 100–199)
Chol/HDL Ratio: 3 ratio (ref 0.0–4.4)
HDL: 44 mg/dL (ref >39)
LDL CALC: 75 mg/dL (ref 0–99)
TRIGLYCERIDES: 56 mg/dL (ref 0–149)
VLDL Cholesterol Cal: 11 mg/dL (ref 5–40)

## 2016-06-23 LAB — CBC WITH DIFFERENTIAL/PLATELET
BASOS ABS: 0 10*3/uL (ref 0.0–0.2)
Basos: 1 %
EOS (ABSOLUTE): 0.1 10*3/uL (ref 0.0–0.4)
Eos: 2 %
HEMATOCRIT: 40.1 % (ref 34.0–46.6)
Hemoglobin: 12.4 g/dL (ref 11.1–15.9)
Immature Grans (Abs): 0 10*3/uL (ref 0.0–0.1)
Immature Granulocytes: 0 %
LYMPHS ABS: 2 10*3/uL (ref 0.7–3.1)
Lymphs: 34 %
MCH: 22.4 pg — ABNORMAL LOW (ref 26.6–33.0)
MCHC: 30.9 g/dL — AB (ref 31.5–35.7)
MCV: 72 fL — ABNORMAL LOW (ref 79–97)
MONOS ABS: 0.3 10*3/uL (ref 0.1–0.9)
Monocytes: 6 %
Neutrophils Absolute: 3.4 10*3/uL (ref 1.4–7.0)
Neutrophils: 57 %
Platelets: 306 10*3/uL (ref 150–379)
RBC: 5.54 x10E6/uL — AB (ref 3.77–5.28)
RDW: 15.3 % (ref 12.3–15.4)
WBC: 5.9 10*3/uL (ref 3.4–10.8)

## 2016-06-23 LAB — CMP14+EGFR
ALK PHOS: 49 IU/L (ref 39–117)
ALT: 16 IU/L (ref 0–32)
AST: 19 IU/L (ref 0–40)
Albumin/Globulin Ratio: 1.5 (ref 1.2–2.2)
Albumin: 4.3 g/dL (ref 3.5–5.5)
BUN/Creatinine Ratio: 7 — ABNORMAL LOW (ref 9–23)
BUN: 4 mg/dL — ABNORMAL LOW (ref 6–20)
Bilirubin Total: <0.2 mg/dL (ref 0.0–1.2)
CO2: 25 mmol/L (ref 18–29)
CREATININE: 0.6 mg/dL (ref 0.57–1.00)
Calcium: 9 mg/dL (ref 8.7–10.2)
Chloride: 104 mmol/L (ref 96–106)
GFR calc Af Amer: 143 mL/min/{1.73_m2} (ref >59)
GFR calc non Af Amer: 124 mL/min/{1.73_m2} (ref >59)
GLOBULIN, TOTAL: 2.8 g/dL (ref 1.5–4.5)
Glucose: 83 mg/dL (ref 65–99)
Potassium: 4 mmol/L (ref 3.5–5.2)
SODIUM: 140 mmol/L (ref 134–144)
Total Protein: 7.1 g/dL (ref 6.0–8.5)

## 2016-06-23 LAB — SICKLE CELL SCREEN

## 2016-06-23 LAB — TSH: TSH: 2.69 u[IU]/mL (ref 0.450–4.500)

## 2016-06-23 LAB — VITAMIN D 25 HYDROXY (VIT D DEFICIENCY, FRACTURES)

## 2016-06-24 LAB — PATHOLOGIST SMEAR REVIEW
BASOS: 1 %
Basophils Absolute: 0.1 10*3/uL (ref 0.0–0.2)
EOS (ABSOLUTE): 0.2 10*3/uL (ref 0.0–0.4)
Eos: 3 %
HEMATOCRIT: 39.5 % (ref 34.0–46.6)
Hemoglobin: 12.2 g/dL (ref 11.1–15.9)
IMMATURE GRANULOCYTES: 0 %
Immature Grans (Abs): 0 10*3/uL (ref 0.0–0.1)
LYMPHS ABS: 2 10*3/uL (ref 0.7–3.1)
Lymphs: 33 %
MCH: 22.5 pg — AB (ref 26.6–33.0)
MCHC: 30.9 g/dL — ABNORMAL LOW (ref 31.5–35.7)
MCV: 73 fL — AB (ref 79–97)
MONOS ABS: 0.4 10*3/uL (ref 0.1–0.9)
Monocytes: 6 %
NEUTROS PCT: 57 %
Neutrophils Absolute: 3.3 10*3/uL (ref 1.4–7.0)
PLATELETS: 296 10*3/uL (ref 150–379)
Path Rev PLTs: NORMAL
Path Rev WBC: NORMAL
RBC: 5.42 x10E6/uL — ABNORMAL HIGH (ref 3.77–5.28)
RDW: 15.2 % (ref 12.3–15.4)
WBC: 5.9 10*3/uL (ref 3.4–10.8)

## 2016-06-24 LAB — SICKLE CELL SCREEN: Sickle Cell Screen: NEGATIVE

## 2016-06-24 LAB — VITAMIN D 25 HYDROXY (VIT D DEFICIENCY, FRACTURES): VIT D 25 HYDROXY: 37.6 ng/mL (ref 30.0–100.0)

## 2016-06-24 LAB — SPECIMEN STATUS REPORT

## 2016-06-25 DIAGNOSIS — Z130 Encounter for screening for diseases of the blood and blood-forming organs and certain disorders involving the immune mechanism: Secondary | ICD-10-CM | POA: Diagnosis not present

## 2016-06-25 NOTE — Addendum Note (Signed)
Addended by: Baldwin CrownJOHNSON, Larkin Morelos D on: 06/25/2016 09:58 AM   Modules accepted: Orders

## 2016-06-26 LAB — IRON: IRON: 48 ug/dL (ref 27–159)

## 2016-06-30 ENCOUNTER — Encounter: Payer: Self-pay | Admitting: Radiology

## 2016-07-27 ENCOUNTER — Ambulatory Visit (INDEPENDENT_AMBULATORY_CARE_PROVIDER_SITE_OTHER): Payer: 59 | Admitting: Physician Assistant

## 2016-07-27 ENCOUNTER — Encounter: Payer: Self-pay | Admitting: Physician Assistant

## 2016-07-27 VITALS — BP 110/72 | HR 88 | Temp 98.5°F | Resp 16 | Ht 70.5 in | Wt 167.6 lb

## 2016-07-27 DIAGNOSIS — J029 Acute pharyngitis, unspecified: Secondary | ICD-10-CM

## 2016-07-27 DIAGNOSIS — J309 Allergic rhinitis, unspecified: Secondary | ICD-10-CM

## 2016-07-27 DIAGNOSIS — K120 Recurrent oral aphthae: Secondary | ICD-10-CM

## 2016-07-27 DIAGNOSIS — R05 Cough: Secondary | ICD-10-CM

## 2016-07-27 DIAGNOSIS — R0982 Postnasal drip: Secondary | ICD-10-CM

## 2016-07-27 DIAGNOSIS — R059 Cough, unspecified: Secondary | ICD-10-CM

## 2016-07-27 MED ORDER — LORATADINE 10 MG PO TABS: 10.0000 mg | ORAL_TABLET | Freq: Every day | ORAL | 11 refills | Status: DC

## 2016-07-27 MED ORDER — FLUTICASONE PROPIONATE 50 MCG/ACT NA SUSP: 2.0000 | Freq: Every day | NASAL | 6 refills | Status: DC

## 2016-07-27 NOTE — Patient Instructions (Addendum)
For your mouth ulcers (aphthous ulcers), start taking vitamin B12 1,000 mcg daily. You can buy this at your pharmacy. Where possible, reduce traumatic factors inside the mouth (eg, sharp/rough dental restorations, braces). Avoid habits that cause trauma (eg, biting cheeks or lips) and foods that seem to exacerbate the process. It is important to maintain good dental hygiene while at the same time avoiding trauma. A soft toothbrush, waxed tape-style dental floss, and a soft-tipped gum stimulator to gently remove plaque are generally well tolerated. A nonalcohol-containing mouthwash is often less irritating, but still effective, in decreasing microbial overgrowth.  For your post nasal drip: Flonase - 2 sprays each nostril twice daily Loratadine (Claritin) take this daily.  Try using a neti pot to rinse your nasal passages.  Stay well hydrated - drink 2-3 liters of water daily. Avoid dairy products, as these can increase inflammation.   You will receive a phone call to schedule your appointment with the ear, nose and throat specialist.   Thank you for coming in today. I hope you feel we met your needs.  Feel free to call [facility] if you have any questions or further requests.  Please consider signing up for MyChart if you do not already have it, as this is a great way to communicate with me.  Best,  Whitney McVey, PA-C   IF you received an x-ray today, you will receive an invoice from [facility]. Please contact [facility] at [phone] with questions or concerns regarding your invoice.   IF you received labwork today, you will receive an invoice from [place]. Please contact [facility] at [phone] with questions or concerns regarding your invoice.   Our billing staff will not be able to assist you with questions regarding bills from these companies.  You will be contacted with the lab results as soon as they are available. The fastest way to get your results is to activate  your My Chart account. Instructions are located on the last page of this paperwork. If you have not heard from [place] regarding the results in 2 weeks, please contact this office.

## 2016-07-27 NOTE — Progress Notes (Signed)
Madison Kent  MRN: [id number] DOB: 13-Jan-1986  PCP: Joanna Pufforsey, Crystal S, MD  Subjective:  Pt is a 31 year old female who presents to clinic for sore throat x 6 years. Her sore throat started after she had a procedure to remove a "bump in her throat" - this was when she was still living in [place]. "It was one of those bumps full of water and they drained it" Endorses cough x 1 month. She coughs up phlegm occasionally. Cough is worse when she lays down. +occasional sneezing and runny nose Denies fever, chills, drooling, difficulty swallowing, difficulty breathing, wheezing, shob, weight loss.   C/o of ulcers in her mouth x several days.   She has been having problems with sore throat x 6 years. She "feels like there is something in my throat" x 6 years. She is concerned about throat cancer. Would like to see a specialist.   Review of Systems  Constitutional: Negative for chills, diaphoresis, fatigue and fever.  HENT: Positive for sore throat. Negative for congestion, postnasal drip, rhinorrhea, sinus pressure and sneezing.   Respiratory: Positive for cough. Negative for chest tightness, shortness of breath and wheezing.   Cardiovascular: Negative for chest pain and palpitations.  Gastrointestinal: Negative for abdominal pain, diarrhea, nausea and vomiting.  Neurological: Negative for weakness, light-headedness and headaches.    Patient Active Problem List   Diagnosis Date Noted  . Abdominal pain, chronic, right lower quadrant 07/05/2014  . Infertility management 04/20/2014  . Health care maintenance 04/20/2014  . Anemia 12/07/2013  . Irregular menses 12/07/2013    Current Outpatient Prescriptions on File Prior to Visit  Medication Sig Dispense Refill  . Vitamin D, Ergocalciferol, (DRISDOL) 50000 UNITS CAPS capsule Take 50,000 Units by mouth every 7 (seven) days.    . ferrous fumarate (HEMOCYTE - 106 MG FE) 325 (106 FE) MG TABS tablet Take 1 tablet by mouth.    . Multiple  Vitamins-Minerals (HAIR/SKIN/NAILS PO) Place 2 tablets under the tongue daily.    . Prenatal Vit-Fe Fumarate-FA (PRENATAL MULTIVITAMIN) TABS tablet Take 1 tablet by mouth daily at 12 noon.     No current facility-administered medications on file prior to visit.     No Known Allergies   Objective:  BP 110/72   Pulse 88   Temp 98.5 F (36.9 C) (Oral)   Resp 16   Ht 5' 10.5" (1.791 m)   Wt 167 lb 9.6 oz (76 kg)   LMP 07/26/2016   SpO2 99%   BMI 23.71 kg/m   Physical Exam  Constitutional: She is oriented to person, place, and time and well-developed, well-nourished, and in no distress. No distress.  HENT:  Right Ear: Tympanic membrane normal.  Left Ear: Tympanic membrane normal.  Nose: Mucosal edema present. No rhinorrhea. Right sinus exhibits no maxillary sinus tenderness and no frontal sinus tenderness. Left sinus exhibits no maxillary sinus tenderness and no frontal sinus tenderness.  Mouth/Throat: Uvula is midline and mucous membranes are normal. Posterior oropharyngeal edema present. No oropharyngeal exudate, posterior oropharyngeal erythema or tonsillar abscesses.  Cardiovascular: Normal rate, regular rhythm and normal heart sounds.   Pulmonary/Chest: Effort normal and breath sounds normal.  Neurological: She is alert and oriented to person, place, and time. GCS score is 15.  Skin: Skin is warm and dry.  Psychiatric: Mood, memory, affect and judgment normal.  Vitals reviewed.   Assessment and Plan :  1. Post-nasal drip 2. Allergic rhinitis, unspecified seasonality, unspecified trigger 3. Cough 4. Sore throat -  fluticasone (FLONASE) 50 MCG/ACT nasal spray; Place 2 sprays into both nostrils daily.  Dispense: 16 g; Refill: 6 - loratadine (CLARITIN) 10 MG tablet; Take 1 tablet (10 mg total) by mouth daily.  Dispense: 30 tablet; Refill: 11 - Ambulatory referral to ENT - Pt c/o sore throat x 6 years. She is concerned for throat cancer. Discussed with pt her symptoms are in  line with allergic rhinitis and post nasal drip. She agrees to treatment, however would like a referral to a specialist.  5. Oral aphthous ulcer - Advised vitamin B supplement until symptoms resolve.   Marco Collie, PA-C  [facility] at [facility] 07/27/2016 5:50 PM

## 2016-08-10 DIAGNOSIS — J300 Vasomotor rhinitis: Secondary | ICD-10-CM | POA: Diagnosis not present

## 2017-08-04 ENCOUNTER — Ambulatory Visit (INDEPENDENT_AMBULATORY_CARE_PROVIDER_SITE_OTHER): Payer: 59 | Admitting: Physician Assistant

## 2017-08-04 ENCOUNTER — Other Ambulatory Visit: Payer: Self-pay

## 2017-08-04 ENCOUNTER — Encounter: Payer: Self-pay | Admitting: Physician Assistant

## 2017-08-04 VITALS — BP 104/70 | HR 83 | Temp 98.7°F | Resp 16 | Ht 70.0 in | Wt 166.8 lb

## 2017-08-04 DIAGNOSIS — Z13228 Encounter for screening for other metabolic disorders: Secondary | ICD-10-CM | POA: Diagnosis not present

## 2017-08-04 DIAGNOSIS — Z8639 Personal history of other endocrine, nutritional and metabolic disease: Secondary | ICD-10-CM | POA: Diagnosis not present

## 2017-08-04 DIAGNOSIS — Z23 Encounter for immunization: Secondary | ICD-10-CM | POA: Diagnosis not present

## 2017-08-04 DIAGNOSIS — Z124 Encounter for screening for malignant neoplasm of cervix: Secondary | ICD-10-CM

## 2017-08-04 DIAGNOSIS — Z1329 Encounter for screening for other suspected endocrine disorder: Secondary | ICD-10-CM

## 2017-08-04 DIAGNOSIS — L709 Acne, unspecified: Secondary | ICD-10-CM | POA: Diagnosis not present

## 2017-08-04 DIAGNOSIS — Z130 Encounter for screening for diseases of the blood and blood-forming organs and certain disorders involving the immune mechanism: Secondary | ICD-10-CM | POA: Diagnosis not present

## 2017-08-04 DIAGNOSIS — Z0000 Encounter for general adult medical examination without abnormal findings: Secondary | ICD-10-CM | POA: Diagnosis not present

## 2017-08-04 DIAGNOSIS — Z111 Encounter for screening for respiratory tuberculosis: Secondary | ICD-10-CM | POA: Diagnosis not present

## 2017-08-04 LAB — POCT URINALYSIS DIP (MANUAL ENTRY)
Bilirubin, UA: NEGATIVE
Blood, UA: NEGATIVE
Glucose, UA: NEGATIVE mg/dL
Ketones, POC UA: NEGATIVE mg/dL
Leukocytes, UA: NEGATIVE
Nitrite, UA: NEGATIVE
Protein Ur, POC: 30 mg/dL — AB
Spec Grav, UA: 1.015 (ref 1.010–1.025)
Urobilinogen, UA: 0.2 E.U./dL
pH, UA: 8 (ref 5.0–8.0)

## 2017-08-04 NOTE — Patient Instructions (Addendum)
You will need to come back and have the form filled out when your test results come back.  Make an appointment to have your teeth cleaned.    Health Maintenance, Female Adopting a healthy lifestyle and getting preventive care can go a long way to promote health and wellness. Talk with your health care provider about what schedule of regular examinations is right for you. This is a good chance for you to check in with your provider about disease prevention and staying healthy. In between checkups, there are plenty of things you can do on your own. Experts have done a lot of research about which lifestyle changes and preventive measures are most likely to keep you healthy. Ask your health care provider for more information. Weight and diet Eat a healthy diet  Be sure to include plenty of vegetables, fruits, low-fat dairy products, and lean protein.  Do not eat a lot of foods high in solid fats, added sugars, or salt.  Get regular exercise. This is one of the most important things you can do for your health. ? Most adults should exercise for at least 150 minutes each week. The exercise should increase your heart rate and make you sweat (moderate-intensity exercise). ? Most adults should also do strengthening exercises at least twice a week. This is in addition to the moderate-intensity exercise.  Maintain a healthy weight  Body mass index (BMI) is a measurement that can be used to identify possible weight problems. It estimates body fat based on height and weight. Your health care provider can help determine your BMI and help you achieve or maintain a healthy weight.  For females 61 years of age and older: ? A BMI below 18.5 is considered underweight. ? A BMI of 18.5 to 24.9 is normal. ? A BMI of 25 to 29.9 is considered overweight. ? A BMI of 30 and above is considered obese.  Watch levels of cholesterol and blood lipids  You should start having your blood tested for lipids and  cholesterol at 30 years of age, then have this test every 5 years.  You may need to have your cholesterol levels checked more often if: ? Your lipid or cholesterol levels are high. ? You are older than 30 years of age. ? You are at high risk for heart disease.  Cancer screening Lung Cancer  Lung cancer screening is recommended for adults 61-71 years old who are at high risk for lung cancer because of a history of smoking.  A yearly low-dose CT scan of the lungs is recommended for people who: ? Currently smoke. ? Have quit within the past 15 years. ? Have at least a 30-pack-year history of smoking. A pack year is smoking an average of one pack of cigarettes a day for 1 year.  Yearly screening should continue until it has been 15 years since you quit.  Yearly screening should stop if you develop a health problem that would prevent you from having lung cancer treatment.  Breast Cancer  Practice breast self-awareness. This means understanding how your breasts normally appear and feel.  It also means doing regular breast self-exams. Let your health care provider know about any changes, no matter how small.  If you are in your 20s or 30s, you should have a clinical breast exam (CBE) by a health care provider every 1-3 years as part of a regular health exam.  If you are 34 or older, have a CBE every year. Also consider having a breast  X-ray (mammogram) every year.  If you have a family history of breast cancer, talk to your health care provider about genetic screening.  If you are at high risk for breast cancer, talk to your health care provider about having an MRI and a mammogram every year.  Breast cancer gene (BRCA) assessment is recommended for women who have family members with BRCA-related cancers. BRCA-related cancers include: ? Breast. ? Ovarian. ? Tubal. ? Peritoneal cancers.  Results of the assessment will determine the need for genetic counseling and BRCA1 and BRCA2  testing.  Cervical Cancer Your health care provider may recommend that you be screened regularly for cancer of the pelvic organs (ovaries, uterus, and vagina). This screening involves a pelvic examination, including checking for microscopic changes to the surface of your cervix (Pap test). You may be encouraged to have this screening done every 3 years, beginning at age 21.  For women ages 30-65, health care providers may recommend pelvic exams and Pap testing every 3 years, or they may recommend the Pap and pelvic exam, combined with testing for human papilloma virus (HPV), every 5 years. Some types of HPV increase your risk of cervical cancer. Testing for HPV may also be done on women of any age with unclear Pap test results.  Other health care providers may not recommend any screening for nonpregnant women who are considered low risk for pelvic cancer and who do not have symptoms. Ask your health care provider if a screening pelvic exam is right for you.  If you have had past treatment for cervical cancer or a condition that could lead to cancer, you need Pap tests and screening for cancer for at least 20 years after your treatment. If Pap tests have been discontinued, your risk factors (such as having a new sexual partner) need to be reassessed to determine if screening should resume. Some women have medical problems that increase the chance of getting cervical cancer. In these cases, your health care provider may recommend more frequent screening and Pap tests.  Colorectal Cancer  This type of cancer can be detected and often prevented.  Routine colorectal cancer screening usually begins at 30 years of age and continues through 30 years of age.  Your health care provider may recommend screening at an earlier age if you have risk factors for colon cancer.  Your health care provider may also recommend using home test kits to check for hidden blood in the stool.  A small camera at the end of a  tube can be used to examine your colon directly (sigmoidoscopy or colonoscopy). This is done to check for the earliest forms of colorectal cancer.  Routine screening usually begins at age 50.  Direct examination of the colon should be repeated every 5-10 years through 30 years of age. However, you may need to be screened more often if early forms of precancerous polyps or small growths are found.  Skin Cancer  Check your skin from head to toe regularly.  Tell your health care provider about any new moles or changes in moles, especially if there is a change in a mole's shape or color.  Also tell your health care provider if you have a mole that is larger than the size of a pencil eraser.  Always use sunscreen. Apply sunscreen liberally and repeatedly throughout the day.  Protect yourself by wearing long sleeves, pants, a wide-brimmed hat, and sunglasses whenever you are outside.  Heart disease, diabetes, and high blood pressure  High blood   pressure causes heart disease and increases the risk of stroke. High blood pressure is more likely to develop in: ? People who have blood pressure in the high end of the normal range (130-139/85-89 mm Hg). ? People who are overweight or obese. ? People who are African American.  If you are 18-39 years of age, have your blood pressure checked every 3-5 years. If you are 40 years of age or older, have your blood pressure checked every year. You should have your blood pressure measured twice-once when you are at a hospital or clinic, and once when you are not at a hospital or clinic. Record the average of the two measurements. To check your blood pressure when you are not at a hospital or clinic, you can use: ? An automated blood pressure machine at a pharmacy. ? A home blood pressure monitor.  If you are between 55 years and 79 years old, ask your health care provider if you should take aspirin to prevent strokes.  Have regular diabetes screenings. This  involves taking a blood sample to check your fasting blood sugar level. ? If you are at a normal weight and have a low risk for diabetes, have this test once every three years after 30 years of age. ? If you are overweight and have a high risk for diabetes, consider being tested at a younger age or more often. Preventing infection Hepatitis B  If you have a higher risk for hepatitis B, you should be screened for this virus. You are considered at high risk for hepatitis B if: ? You were born in a country where hepatitis B is common. Ask your health care provider which countries are considered high risk. ? Your parents were born in a high-risk country, and you have not been immunized against hepatitis B (hepatitis B vaccine). ? You have HIV or AIDS. ? You use needles to inject street drugs. ? You live with someone who has hepatitis B. ? You have had sex with someone who has hepatitis B. ? You get hemodialysis treatment. ? You take certain medicines for conditions, including cancer, organ transplantation, and autoimmune conditions.  Hepatitis C  Blood testing is recommended for: ? Everyone born from 1945 through 1965. ? Anyone with known risk factors for hepatitis C.  Sexually transmitted infections (STIs)  You should be screened for sexually transmitted infections (STIs) including gonorrhea and chlamydia if: ? You are sexually active and are younger than 30 years of age. ? You are older than 30 years of age and your health care provider tells you that you are at risk for this type of infection. ? Your sexual activity has changed since you were last screened and you are at an increased risk for chlamydia or gonorrhea. Ask your health care provider if you are at risk.  If you do not have HIV, but are at risk, it may be recommended that you take a prescription medicine daily to prevent HIV infection. This is called pre-exposure prophylaxis (PrEP). You are considered at risk if: ? You are  sexually active and do not regularly use condoms or know the HIV status of your partner(s). ? You take drugs by injection. ? You are sexually active with a partner who has HIV.  Talk with your health care provider about whether you are at high risk of being infected with HIV. If you choose to begin PrEP, you should first be tested for HIV. You should then be tested every 3 months for as   long as you are taking PrEP. Pregnancy  If you are premenopausal and you may become pregnant, ask your health care provider about preconception counseling.  If you may become pregnant, take 400 to 800 micrograms (mcg) of folic acid every day.  If you want to prevent pregnancy, talk to your health care provider about birth control (contraception). Osteoporosis and menopause  Osteoporosis is a disease in which the bones lose minerals and strength with aging. This can result in serious bone fractures. Your risk for osteoporosis can be identified using a bone density scan.  If you are 73 years of age or older, or if you are at risk for osteoporosis and fractures, ask your health care provider if you should be screened.  Ask your health care provider whether you should take a calcium or vitamin D supplement to lower your risk for osteoporosis.  Menopause may have certain physical symptoms and risks.  Hormone replacement therapy may reduce some of these symptoms and risks. Talk to your health care provider about whether hormone replacement therapy is right for you. Follow these instructions at home:  Schedule regular health, dental, and eye exams.  Stay current with your immunizations.  Do not use any tobacco products including cigarettes, chewing tobacco, or electronic cigarettes.  If you are pregnant, do not drink alcohol.  If you are breastfeeding, limit how much and how often you drink alcohol.  Limit alcohol intake to no more than 1 drink per day for nonpregnant women. One drink equals 12 ounces of  beer, 5 ounces of wine, or 1 ounces of hard liquor.  Do not use street drugs.  Do not share needles.  Ask your health care provider for help if you need support or information about quitting drugs.  Tell your health care provider if you often feel depressed.  Tell your health care provider if you have ever been abused or do not feel safe at home. This information is not intended to replace advice given to you by your health care provider. Make sure you discuss any questions you have with your health care provider. Document Released: 08/11/2010 Document Revised: 07/04/2015 Document Reviewed: 10/30/2014 Elsevier Interactive Patient Education  2018 [employer].  IF you received an x-ray today, you will receive an invoice from [facility]. Please contact [facility] at [phone] with questions or concerns regarding your invoice.   IF you received labwork today, you will receive an invoice from [facility]. Please contact [facility] at [phone] with questions or concerns regarding your invoice.   Our billing staff will not be able to assist you with questions regarding bills from these companies.  You will be contacted with the lab results as soon as they are available. The fastest way to get your results is to activate your My Chart account. Instructions are located on the last page of this paperwork. If you have not heard from [place] regarding the results in 2 weeks, please contact this office.

## 2017-08-04 NOTE — Progress Notes (Signed)
[place] [facility] [place], [place] [address] [phone]  Date:  08/04/2017   Name:  Madison Kent   DOB:  10/03/1985   MRN:  [id number]  PCP:  Patient, No Pcp Per    Chief Complaint: Annual Exam (w/pap)   History of Present Illness:  This is a 32 y.o. female who  has a past medical history of Allergy, Anemia, Headache, and Sinusitis nasal. who is presenting for CPE. She is in school at [employer] and needs forms filled out today.  She is fasting today.  Complaints: needs forms filled out.  LMP: June 8 Contraception: none Last pap: 05/03/2014  Sexual history: married. Intercourse with husband only.  Immunizations: needs MMR, tdap, polio, varicella. She had the BCG vaccine as a child.  Dentist: >1 year Eye:  56/20 b/l  Fam hx: family history includes Cancer (age of onset: 83) in her father; Diabetes in her father; Hypertension in her father; Sickle cell trait in her mother.  Tobacco/alcohol/substance use: never smoker, no drug use, no alcohol use.    Review of Systems:  Review of Systems  Constitutional: Negative for chills, diaphoresis, fatigue and fever.  HENT: Negative for congestion, postnasal drip, rhinorrhea, sinus pressure, sneezing and sore throat.   Respiratory: Negative for cough, chest tightness, shortness of breath and wheezing.   Cardiovascular: Negative for chest pain and palpitations.  Gastrointestinal: Negative for abdominal pain, diarrhea, nausea and vomiting.  Genitourinary: Negative for decreased urine volume, difficulty urinating, dysuria, enuresis, flank pain, frequency, hematuria and urgency.  Musculoskeletal: Negative for back pain.  Neurological: Negative for dizziness, weakness, light-headedness and headaches.    Patient Active Problem List   Diagnosis Date Noted  . Anemia 12/07/2013  . Irregular menses 12/07/2013    Prior to Admission medications   Medication Sig Start Date End Date Taking? Authorizing  Provider  Multiple Vitamins-Minerals (HAIR/SKIN/NAILS PO) Place 2 tablets under the tongue daily.   Yes [provider]  ferrous fumarate (HEMOCYTE - 106 MG FE) 325 (106 FE) MG TABS tablet Take 1 tablet by mouth.    [provider]  fluticasone (FLONASE) 50 MCG/ACT nasal spray Place 2 sprays into both nostrils daily. Patient not taking: Reported on 08/04/2017 07/27/16   Jasmeet Manton, Gelene Mink, PA-C  loratadine (CLARITIN) 10 MG tablet Take 1 tablet (10 mg total) by mouth daily. Patient not taking: Reported on 08/04/2017 07/27/16   Felicite Zeimet, Gelene Mink, PA-C  Prenatal Vit-Fe Fumarate-FA (PRENATAL MULTIVITAMIN) TABS tablet Take 1 tablet by mouth daily at 12 noon.    [provider]  Vitamin D, Ergocalciferol, (DRISDOL) 50000 UNITS CAPS capsule Take 50,000 Units by mouth every 7 (seven) days.    [provider]    No Known Allergies  Past Surgical History:  Procedure Laterality Date  . DILITATION & CURRETTAGE/HYSTROSCOPY WITH VERSAPOINT RESECTION N/A 01/22/2015   Procedure: DILATATION & CURETTAGE/HYSTEROSCOPY WITH VERSAPOINT RESECTION;  Surgeon: Princess Bruins, MD;  Location: [facility];  Service: Gynecology;  Laterality: N/A;  . FINGER SURGERY    . NO PAST SURGERIES      Social History   Tobacco Use  . Smoking status: Never Smoker  . Smokeless tobacco: Never Used  Substance Use Topics  . Alcohol use: No  . Drug use: No    Family History  Problem Relation Age of Onset  . Cancer Father 40       Liver cancer- Hepatitis B  . Diabetes Father   . Hypertension Father   .  Sickle cell trait Mother   . Colon cancer Neg Hx     Medication list has been reviewed and updated.  Physical Examination:  Physical Exam  Constitutional: She is oriented to person, place, and time. She appears well-developed and well-nourished. No distress.  HENT:  Head: Normocephalic and atraumatic.  Mouth/Throat: Oropharynx is clear and moist.  Eyes: Pupils are equal,  round, and reactive to light. Conjunctivae and EOM are normal.  Neck: Normal range of motion. Neck supple. No thyromegaly present.  Cardiovascular: Normal rate, regular rhythm and normal heart sounds.  No murmur heard. Pulmonary/Chest: Effort normal and breath sounds normal. She has no wheezes.  Abdominal: Soft. There is no tenderness.  Genitourinary: Vagina normal and uterus normal. No vaginal discharge found.  Musculoskeletal: Normal range of motion.  Neurological: She is alert and oriented to person, place, and time. She has normal reflexes.  Skin: Skin is warm and dry.  Psychiatric: She has a normal mood and affect. Her behavior is normal. Judgment and thought content normal.  Vitals reviewed.   BP 104/70 (BP Location: Left Arm, Patient Position: Sitting, Cuff Size: Normal)   Pulse 83   Temp 98.7 F (37.1 C) (Oral)   Resp 16   Ht '5\' 10"'  (1.778 m)   Wt 166 lb 12.8 oz (75.7 kg)   LMP 07/17/2017   SpO2 99%   BMI 23.93 kg/m   Assessment and Plan: 1. Annual physical exam -Patient presents for annual exam.  Pap done today.  MMR and Tdap administered.  Patient had BCG vaccine as a child, TB Gold done today-will contact with results.  She will bring back her paperwork to be completed with TB results.  Routine labs are pending, will contact with results. anticipatory guidance provided. 2. Screening for endocrine, metabolic and immunity disorder - CBC with Differential/Platelet - Iron, TIBC and Ferritin Panel - POCT urinalysis dipstick - CMP14+EGFR - Lipid panel - TSH  3. History of vitamin D deficiency - VITAMIN D 25 Hydroxy (Vit-D Deficiency, Fractures)  4. Screening for cervical cancer - Pap IG and HPV (high risk) DNA detection  5. Need for diphtheria-tetanus-pertussis (Tdap) vaccine - Tdap vaccine greater than or equal to 7yo IM  6. Screening-pulmonary TB - QuantiFERON-TB Gold Plus  7. Need for measles-mumps-rubella (MMR) vaccine - MMR vaccine subcutaneous  8. Acne,  unspecified acne type - Ambulatory referral to Dermatology   Mercer Pod, PA-C  [facility] 08/04/2017 2:03 PM

## 2017-08-05 LAB — TSH: TSH: 1.4 u[IU]/mL (ref 0.450–4.500)

## 2017-08-06 ENCOUNTER — Telehealth: Payer: Self-pay | Admitting: Physician Assistant

## 2017-08-06 NOTE — Telephone Encounter (Unsigned)
Copied from CRM [id number]. Topic: Quick Communication - Lab Results >> Aug 06, 2017  4:09 PM Floria RavelingStovall, Shana A wrote: Pt called in and would like someone to call her about her lab results on 6/26.

## 2017-08-08 LAB — LIPID PANEL
Chol/HDL Ratio: 2.3 ratio (ref 0.0–4.4)
Cholesterol, Total: 112 mg/dL (ref 100–199)
HDL: 48 mg/dL (ref >39)
LDL Calculated: 56 mg/dL (ref 0–99)
Triglycerides: 40 mg/dL (ref 0–149)
VLDL Cholesterol Cal: 8 mg/dL (ref 5–40)

## 2017-08-08 LAB — CMP14+EGFR
ALT: 5 IU/L (ref 0–32)
AST: 11 IU/L (ref 0–40)
Albumin/Globulin Ratio: 1.8 (ref 1.2–2.2)
Albumin: 4.6 g/dL (ref 3.5–5.5)
Alkaline Phosphatase: 46 IU/L (ref 39–117)
BUN/Creatinine Ratio: 11 (ref 9–23)
BUN: 7 mg/dL (ref 6–20)
Bilirubin Total: 0.3 mg/dL (ref 0.0–1.2)
CO2: 24 mmol/L (ref 20–29)
Calcium: 9.7 mg/dL (ref 8.7–10.2)
Chloride: 102 mmol/L (ref 96–106)
Creatinine, Ser: 0.62 mg/dL (ref 0.57–1.00)
GFR calc Af Amer: 140 mL/min/{1.73_m2} (ref >59)
GFR calc non Af Amer: 121 mL/min/{1.73_m2} (ref >59)
Globulin, Total: 2.5 g/dL (ref 1.5–4.5)
Glucose: 99 mg/dL (ref 65–99)
Potassium: 4.1 mmol/L (ref 3.5–5.2)
Sodium: 139 mmol/L (ref 134–144)
Total Protein: 7.1 g/dL (ref 6.0–8.5)

## 2017-08-08 LAB — IRON,TIBC AND FERRITIN PANEL
Ferritin: 18 ng/mL (ref 15–150)
Iron Saturation: 16 % (ref 15–55)
Iron: 50 ug/dL (ref 27–159)
Total Iron Binding Capacity: 316 ug/dL (ref 250–450)
UIBC: 266 ug/dL (ref 131–425)

## 2017-08-08 LAB — CBC WITH DIFFERENTIAL/PLATELET
Basophils Absolute: 0 10*3/uL (ref 0.0–0.2)
Basos: 0 %
EOS (ABSOLUTE): 0.1 10*3/uL (ref 0.0–0.4)
Eos: 2 %
Hematocrit: 38.2 % (ref 34.0–46.6)
Hemoglobin: 12.7 g/dL (ref 11.1–15.9)
Immature Grans (Abs): 0 10*3/uL (ref 0.0–0.1)
Immature Granulocytes: 0 %
Lymphocytes Absolute: 1.7 10*3/uL (ref 0.7–3.1)
Lymphs: 24 %
MCH: 23.6 pg — ABNORMAL LOW (ref 26.6–33.0)
MCHC: 33.2 g/dL (ref 31.5–35.7)
MCV: 71 fL — ABNORMAL LOW (ref 79–97)
Monocytes Absolute: 0.5 10*3/uL (ref 0.1–0.9)
Monocytes: 7 %
Neutrophils Absolute: 4.6 10*3/uL (ref 1.4–7.0)
Neutrophils: 67 %
Platelets: 283 10*3/uL (ref 150–450)
RBC: 5.37 x10E6/uL — ABNORMAL HIGH (ref 3.77–5.28)
RDW: 15.8 % — ABNORMAL HIGH (ref 12.3–15.4)
WBC: 6.8 10*3/uL (ref 3.4–10.8)

## 2017-08-08 LAB — VITAMIN D 25 HYDROXY (VIT D DEFICIENCY, FRACTURES): Vit D, 25-Hydroxy: 46.6 ng/mL (ref 30.0–100.0)

## 2017-08-08 LAB — PAP IG AND HPV HIGH-RISK
HPV, high-risk: NEGATIVE
PAP Smear Comment: 0

## 2017-08-08 LAB — QUANTIFERON-TB GOLD PLUS
QuantiFERON Mitogen Value: >10 [IU]/mL
QuantiFERON Nil Value: 0.05 [IU]/mL
QuantiFERON TB1 Ag Value: 0.07 [IU]/mL
QuantiFERON TB2 Ag Value: 0.03 IU/mL
QuantiFERON-TB Gold Plus: NEGATIVE

## 2017-08-08 NOTE — Telephone Encounter (Signed)
Pt message re: lab results  Have not been released yet. Sent to [place]

## 2017-08-09 ENCOUNTER — Telehealth: Payer: Self-pay | Admitting: Physician Assistant

## 2017-08-09 ENCOUNTER — Encounter: Payer: Self-pay | Admitting: Physician Assistant

## 2017-08-09 NOTE — Telephone Encounter (Signed)
Copied from CRM [id number]. Topic: General - Other >> Aug 09, 2017  3:39 PM Mcneil, Ja-Kwan wrote: Reason for CRM: Pt called once again for lab results. Pt states she really need the results before 08/12/17. Pt request call back. Cb# [phone]

## 2017-08-09 NOTE — Telephone Encounter (Signed)
Seen by PA McVey.

## 2017-08-09 NOTE — Progress Notes (Signed)
Please call patient and let her know she can come back to have her school form filled out with TB results, which are negative. The rest of her labs look great,  I will send her a letter in the mail with the results.

## 2017-08-13 DIAGNOSIS — Z0000 Encounter for general adult medical examination without abnormal findings: Secondary | ICD-10-CM | POA: Diagnosis not present

## 2017-08-13 NOTE — Addendum Note (Signed)
Addended by: Rosalee KaufmanOTTER, Alanson Hausmann on: 08/13/2017 04:19 PM   Modules accepted: Orders

## 2017-08-14 LAB — VARICELLA ZOSTER ANTIBODY, IGG: Varicella zoster IgG: 1410 {index} (ref >165)

## 2017-08-17 ENCOUNTER — Telehealth: Payer: Self-pay | Admitting: Physician Assistant

## 2017-08-17 NOTE — Telephone Encounter (Signed)
Copied from CRM [phone]#[id number]. Topic: Inquiry >> Aug 17, 2017  3:30 PM Madison KalataMichael, Taylor L, NT wrote: Reason for CRM: patient would like a call back to get her lab results from 08/13/17 as soon as possible.

## 2017-08-17 NOTE — Telephone Encounter (Signed)
Copied from CRM #[id number]. Topic: Inquiry °>> Aug 17, 2017  3:30 PM Madison Kent, Taylor L, NT wrote: °Reason for CRM: patient would like a call back to get her lab results from 08/13/17 as soon as possible. °

## 2017-08-19 ENCOUNTER — Telehealth: Payer: Self-pay | Admitting: *Deleted

## 2017-08-19 NOTE — Telephone Encounter (Signed)
Lab results from Chicken Pox titer are negative. Attempted to call pt. No answer and VMbox is full unable to leave message. Letter sent with results.

## 2017-08-27 ENCOUNTER — Telehealth: Payer: Self-pay | Admitting: Physician Assistant

## 2017-08-27 NOTE — Telephone Encounter (Signed)
Not sure if the patient needs a TDAP, can someone in clinical please check and let me know if I need to make her an appointment.

## 2017-08-27 NOTE — Telephone Encounter (Signed)
Copied from [place] [phone]. Topic: Appointment Scheduling - Scheduling Inquiry for Clinic >> Aug 27, 2017 12:12 PM Mylinda Latina, NT wrote: Reason for CRM: Patient called and states that she is due for her next series of the MMR . She is unsure if she needs another Tdap. Please call patient to schedule CB# [phone]

## 2017-08-31 DIAGNOSIS — Z01419 Encounter for gynecological examination (general) (routine) without abnormal findings: Secondary | ICD-10-CM | POA: Diagnosis not present

## 2017-08-31 DIAGNOSIS — Z6824 Body mass index (BMI) 24.0-24.9, adult: Secondary | ICD-10-CM | POA: Diagnosis not present

## 2017-09-01 ENCOUNTER — Ambulatory Visit: Payer: 59 | Admitting: Physician Assistant

## 2017-09-08 ENCOUNTER — Ambulatory Visit: Payer: 59 | Admitting: Physician Assistant

## 2017-09-16 ENCOUNTER — Ambulatory Visit: Payer: 59 | Admitting: Physician Assistant

## 2017-09-16 ENCOUNTER — Encounter: Payer: Self-pay | Admitting: Physician Assistant

## 2017-09-16 ENCOUNTER — Other Ambulatory Visit: Payer: Self-pay

## 2017-09-16 VITALS — BP 114/75 | HR 100 | Temp 99.1°F | Ht 69.29 in | Wt 164.8 lb

## 2017-09-16 DIAGNOSIS — Z0184 Encounter for antibody response examination: Secondary | ICD-10-CM

## 2017-09-16 DIAGNOSIS — J302 Other seasonal allergic rhinitis: Secondary | ICD-10-CM

## 2017-09-16 DIAGNOSIS — R05 Cough: Secondary | ICD-10-CM

## 2017-09-16 DIAGNOSIS — R059 Cough, unspecified: Secondary | ICD-10-CM

## 2017-09-16 NOTE — Patient Instructions (Addendum)
I suspect your cough, runny nose and sneezing are due to allergies.  Start taking an oral antihistamine every day -- you can buy this at your pharmacy. You may need to try different ones to find out which one works best for you. I would start with Zyrtec or Claritin (look at the ingredients... The more expensive brands are made of the same thing as the less expensive ones). Also, start using Flonase in both nostrils at night 30 min before bed.  Stay well hydrated - drink 1-2 liters of water daily.   Come back and see me in 6 weeks if you are not improving.   Allergic Rhinitis, Adult Allergic rhinitis is an allergic reaction that affects the mucous membrane inside the nose. It causes sneezing, a runny or stuffy nose, and the feeling of mucus going down the back of the throat (postnasal drip). Allergic rhinitis can be mild to severe. There are two types of allergic rhinitis:  Seasonal. This type is also called hay fever. It happens only during certain seasons.  Perennial. This type can happen at any time of the year.  What are the causes? This condition happens when the body's defense system (immune system) responds to certain harmless substances called allergens as though they were germs.  Seasonal allergic rhinitis is triggered by pollen, which can come from grasses, trees, and weeds. Perennial allergic rhinitis may be caused by:  House dust mites.  Pet dander.  Mold spores.  What are the signs or symptoms? Symptoms of this condition include:  Sneezing.  Runny or stuffy nose (nasal congestion).  Postnasal drip.  Itchy nose.  Tearing of the eyes.  Trouble sleeping.  Daytime sleepiness.  How is this diagnosed? This condition may be diagnosed based on:  Your medical history.  A physical exam.  Tests to check for related conditions, such as: ? Asthma. ? Pink eye. ? Ear infection. ? Upper respiratory infection.  Tests to find out which allergens trigger your  symptoms. These may include skin or blood tests.  How is this treated? There is no cure for this condition, but treatment can help control symptoms. Treatment may include:  Taking medicines that block allergy symptoms, such as antihistamines. Medicine may be given as a shot, nasal spray, or pill.  Avoiding the allergen.  Desensitization. This treatment involves getting ongoing shots until your body becomes less sensitive to the allergen. This treatment may be done if other treatments do not help.  If taking medicine and avoiding the allergen does not work, new, stronger medicines may be prescribed.  Follow these instructions at home:  Find out what you are allergic to. Common allergens include smoke, dust, and pollen.  Avoid the things you are allergic to. These are some things you can do to help avoid allergens: ? Replace carpet with wood, tile, or vinyl flooring. Carpet can trap dander and dust. ? Do not smoke. Do not allow smoking in your home. ? Change your heating and air conditioning filter at least once a month. ? During allergy season:  Keep windows closed as much as possible.  Plan outdoor activities when pollen counts are lowest. This is usually during the evening hours.  When coming indoors, change clothing and shower before sitting on furniture or bedding.  Take over-the-counter and prescription medicines only as told by your health care provider.  Keep all follow-up visits as told by your health care provider. This is important. Contact a health care provider if:  You have a fever.  You develop a persistent cough.  You make whistling sounds when you breathe (you wheeze).  Your symptoms interfere with your normal daily activities. Get help right away if:  You have shortness of breath. Summary  This condition can be managed by taking medicines as directed and avoiding allergens.  Contact your health care provider if you develop a persistent cough or  fever.  During allergy season, keep windows closed as much as possible. This information is not intended to replace advice given to you by your health care provider. Make sure you discuss any questions you have with your health care provider. Document Released: 10/21/2000 Document Revised: 03/05/2016 Document Reviewed: 03/05/2016 Elsevier Interactive Patient Education  2018 [employer]  IF you received an x-ray today, you will receive an invoice from [facility]. Please contact [facility] at [phone] with questions or concerns regarding your invoice.   IF you received labwork today, you will receive an invoice from [facility]. Please contact [facility] at [phone] with questions or concerns regarding your invoice.   Our billing staff will not be able to assist you with questions regarding bills from these companies.  You will be contacted with the lab results as soon as they are available. The fastest way to get your results is to activate your My Chart account. Instructions are located on the last page of this paperwork. If you have not heard from us regarding the results in 2 weeks, please contact this office.

## 2017-09-17 ENCOUNTER — Encounter: Payer: Self-pay | Admitting: Physician Assistant

## 2017-09-17 DIAGNOSIS — J302 Other seasonal allergic rhinitis: Secondary | ICD-10-CM | POA: Insufficient documentation

## 2017-09-17 LAB — MEASLES/MUMPS/RUBELLA IMMUNITY
MUMPS ABS, IGG: 238 AU/mL (ref >10.9)
RUBEOLA AB, IGG: >300 AU/mL (ref >29.9)
Rubella Antibodies, IGG: 19.9 {index} (ref >0.99)

## 2017-09-17 NOTE — Progress Notes (Signed)
Please call pt and let her know she is immune to MMR. She can bring her school form in for a staff member to complete for her. Thank you!

## 2017-09-17 NOTE — Progress Notes (Signed)
   Madison Kent  MRN: [id number] DOB: 08/19/1985  PCP: Patient, No Pcp Per  Subjective:  Pt is a 32 year old female who presents to clinic for multiple complaints.   She is needing titer for MMR for school. She is in school at [employer].    Itching of both ears, sore throat, cough, runny nose, sneezing and itchy eyes. She has had a cough x several years "I've seen many doctors for this and no one can figure out what it is".  Sneezing occasionally - on a daily basis. Itchy eyes daily. She is unsure if symptoms are worse at different times of the year. Cough is troublesome at night "feels like something is in my throat".  She is unsure if she has seasonal allergies. She does not take antihistamines. Is not taking anything for cough.  Denies fever, chills, diaphoreses, shob, wheezing.   Review of Systems  Constitutional: Negative for chills, diaphoresis, fatigue and fever.  HENT: Positive for rhinorrhea and sneezing. Negative for congestion, sinus pressure, sinus pain and sore throat.   Eyes: Positive for itching. Negative for pain, discharge and redness.  Respiratory: Positive for cough. Negative for chest tightness, shortness of breath and wheezing.     Patient Active Problem List   Diagnosis Date Noted  . Anemia 12/07/2013  . Irregular menses 12/07/2013    Current Outpatient Medications on File Prior to Visit  Medication Sig Dispense Refill  . Multiple Vitamins-Minerals (HAIR/SKIN/NAILS PO) Place 2 tablets under the tongue daily.     No current facility-administered medications on file prior to visit.     No Known Allergies   Objective:  BP 114/75 (BP Location: Right Arm, Patient Position: Sitting, Cuff Size: Normal)   Pulse 100   Temp 99.1 F (37.3 C) (Oral)   Ht 5' 9.29" (1.76 m)   Wt 164 lb 12.8 oz (74.8 kg)   SpO2 100%   BMI 24.13 kg/m   Physical Exam  Constitutional: She is oriented to person, place, and time. No distress.  HENT:  Right  Ear: Tympanic membrane normal.  Left Ear: Tympanic membrane normal.  Nose: Mucosal edema present. No rhinorrhea. Right sinus exhibits no maxillary sinus tenderness and no frontal sinus tenderness. Left sinus exhibits no maxillary sinus tenderness and no frontal sinus tenderness.  Mouth/Throat: Oropharynx is clear and moist and mucous membranes are normal.  Cardiovascular: Normal rate, regular rhythm and normal heart sounds.  Pulmonary/Chest: Effort normal and breath sounds normal. No respiratory distress. She has no wheezes. She has no rales.  Neurological: She is alert and oriented to person, place, and time.  Skin: Skin is warm and dry.  Psychiatric: Judgment normal.  Vitals reviewed.   Assessment and Plan :  1. Seasonal allergies 2. Cough - Pt c/o cough x several years. HPI and PE suggest allergic rhinitis. Plan to treat with otc antihistamines. RTC in 6 weeks for recheck.   3. Immunity status testing - Lab drawn today. She will bring her school forms back to have them completed by a staff member once lab results come back.  - Measles/Mumps/Rubella Immunity   Mercer Pod, PA-C  [facility] 8/8//2019 5:30pm  Please note: Portions of this report may have been transcribed using dragon voice recognition software. Every effort was made to ensure accuracy; however, inadvertent computerized transcription errors may be present.

## 2017-09-21 ENCOUNTER — Telehealth: Payer: Self-pay | Admitting: Physician Assistant

## 2017-09-21 NOTE — Telephone Encounter (Signed)
Copied from CRM [phone]#[id number]. Topic: Quick Communication - Lab Results >> Sep 21, 2017 11:06 AM Leafy Roobinson, Norma J wrote: Pt is calling and had blood work drawn to check her titer. Pt would like results

## 2017-09-22 NOTE — Telephone Encounter (Signed)
Left detailed message she is immune to MMR and can bring in school form to be filled out.

## 2018-04-26 ENCOUNTER — Ambulatory Visit: Payer: 59 | Admitting: Emergency Medicine

## 2018-05-09 ENCOUNTER — Other Ambulatory Visit: Payer: Self-pay

## 2018-05-09 ENCOUNTER — Telehealth (INDEPENDENT_AMBULATORY_CARE_PROVIDER_SITE_OTHER): Payer: 59 | Admitting: Family Medicine

## 2018-05-09 DIAGNOSIS — L299 Pruritus, unspecified: Secondary | ICD-10-CM | POA: Diagnosis not present

## 2018-05-09 MED ORDER — ACETIC ACID 2 % OT SOLN: 4.0000 [drp] | Freq: Four times a day (QID) | OTIC | 0 refills | Status: DC

## 2018-05-09 MED ORDER — FLUTICASONE PROPIONATE 50 MCG/ACT NA SUSP: 1.0000 | Freq: Two times a day (BID) | NASAL | 6 refills | Status: DC

## 2018-05-09 NOTE — Progress Notes (Signed)
Ears are very itchy and feels as though there is something in there. Also itchy on the outside of the ears. Has been using qtips to dig in the ear. No bleeding, just wax, some painful mostly itchy. Also throat is itchy. Taking no meds for the symptoms

## 2018-05-09 NOTE — Progress Notes (Signed)
Virtual Visit via telephone Note  I connected with patient on 05/09/18 at 1033 by telephone and verified that I am speaking with the correct person using two identifiers. Madison Kent is currently located at home and patient is currently with her during visit. The provider, Myles Lipps, MD is located in their office at time of visit.  I discussed the limitations, risks, security and privacy concerns of performing an evaluation and management service by telephone and the availability of in person appointments. I also discussed with the patient that there may be a patient responsible charge related to this service. The patient expressed understanding and agreed to proceed.   Telephone visit today for itchy ears  HPI Having about a month of itchy ears, inside and outside. Sometimes a bit painful Feels it is coming from her throat as she is having PND Feels that her ear canal are wet, denies any drianage? No fever or chills Denies any changes in hearing Denies any seasonal allergies   Fall Risk  05/09/2018 09/16/2017 08/04/2017 07/27/2016 06/22/2016  Falls in the past year? 1 No No Yes Yes  Number falls in past yr: 0 - - - 1  Injury with Fall? 0 - - - Yes     Depression screen [facility] 2/9 05/09/2018 09/16/2017 08/04/2017  Decreased Interest 0 0 0  Down, Depressed, Hopeless 0 0 0  PHQ - 2 Score 0 0 0    No Known Allergies  Prior to Admission medications   Medication Sig Start Date End Date Taking? Authorizing Provider  Multiple Vitamins-Minerals (HAIR/SKIN/NAILS PO) Place 2 tablets under the tongue daily.    [provider]    Past Medical History:  Diagnosis Date  . Allergy   . Anemia   . Headache    Sinus  . Sinusitis nasal     Past Surgical History:  Procedure Laterality Date  . DILITATION & CURRETTAGE/HYSTROSCOPY WITH VERSAPOINT RESECTION N/A 01/22/2015   Procedure: DILATATION & CURETTAGE/HYSTEROSCOPY WITH VERSAPOINT RESECTION;  Surgeon: Genia Del, MD;   Location: [facility];  Service: Gynecology;  Laterality: N/A;  . FINGER SURGERY    . NO PAST SURGERIES      Social History   Tobacco Use  . Smoking status: Never Smoker  . Smokeless tobacco: Never Used  Substance Use Topics  . Alcohol use: No    Family History  Problem Relation Age of Onset  . Cancer Father 56       Liver cancer- Hepatitis B  . Diabetes Father   . Hypertension Father   . Sickle cell trait Mother   . Colon cancer Neg Hx     ROS Per hpi  Objective  Vitals as reported by the patient: none  There were no vitals filed for this visit.  ASSESSMENT and PLAN  1. Ear itching Discussed supportive measures, new meds r/se/b and RTC precautions. Patient educational handout given. Discussed OTC hydrocortisone 1% for use behind the ears Other orders - acetic acid 2 % otic solution; Place 4 drops into both ears 4 (four) times daily. - fluticasone (FLONASE) 50 MCG/ACT nasal spray; Place 1 spray into both nostrils 2 (two) times daily.  FOLLOW-UP: prn, would need to be in person OV   The above assessment and management plan was discussed with the patient. The patient verbalized understanding of and has agreed to the management plan. Patient is aware to call the clinic if symptoms persist or worsen. Patient is aware when to return to the clinic for a  follow-up visit. Patient educated on when it is appropriate to go to the emergency department.    I provided 12 minutes of non-face-to-face time during this encounter.  Myles Lipps, MD Primary Care at [facility] [address] [place], [place] [address] Ph.  [phone] Fax [phone]

## 2018-05-20 ENCOUNTER — Ambulatory Visit: Payer: 59 | Admitting: Family Medicine

## 2018-05-20 ENCOUNTER — Other Ambulatory Visit: Payer: Self-pay

## 2018-05-20 ENCOUNTER — Telehealth: Payer: Self-pay | Admitting: Physician Assistant

## 2018-05-20 ENCOUNTER — Encounter: Payer: Self-pay | Admitting: Family Medicine

## 2018-05-20 VITALS — BP 110/69 | Temp 98.5°F | Ht 69.0 in | Wt 171.4 lb

## 2018-05-20 DIAGNOSIS — S0590XA Unspecified injury of unspecified eye and orbit, initial encounter: Secondary | ICD-10-CM | POA: Diagnosis not present

## 2018-05-20 DIAGNOSIS — H5712 Ocular pain, left eye: Secondary | ICD-10-CM

## 2018-05-20 MED ORDER — KETOROLAC TROMETHAMINE 0.5 % OP SOLN: 1.0000 [drp] | Freq: Four times a day (QID) | OPHTHALMIC | 0 refills | Status: DC

## 2018-05-20 NOTE — Patient Instructions (Addendum)
If you have lab work done today you will be contacted with your lab results within the next 2 weeks.  If you have not heard from us then please contact us. The fastest way to get your results is to register for My Chart.   IF you received an x-ray today, you will receive an invoice from [facility]. Please contact [facility] at [phone] with questions or concerns regarding your invoice.   IF you received labwork today, you will receive an invoice from [facility]. Please contact [facility] at [phone] with questions or concerns regarding your invoice.   Our billing staff will not be able to assist you with questions regarding bills from these companies.  You will be contacted with the lab results as soon as they are available. The fastest way to get your results is to activate your My Chart account. Instructions are located on the last page of this paperwork. If you have not heard from us regarding the results in 2 weeks, please contact this office.      Corneal Abrasion  A corneal abrasion is a scratch or injury to the clear covering over the front of your eye (cornea). Your cornea forms a clear dome that protects your eye and helps to focus your vision. Your cornea is made up of many layers. The surface layer is a single layer of cells (corneal epithelium). It is one of the most sensitive tissues in your body. A corneal abrasion can be very painful. If a corneal abrasion is not treated, it can become infected and cause an ulcer. This can lead to scarring. A scarred cornea can affect your vision. Sometimes abrasions come back in the same area, even after the original injury has healed (recurrent erosion syndrome). What are the causes? This condition may be caused by:  A poke in the eye.  A gritty or irritating substance (foreign body) in the eye.  Excessive eye rubbing.  Very dry eyes.  Certain eye infections.  Contact lenses that fit poorly or are worn  for a long period of time. You can also injure your cornea when putting contacts lenses in your eye or taking them out.  Eye surgery. Sometimes, the cause is unknown. What are the signs or symptoms? Symptoms of this condition include:  Eye pain. The pain may get worse when your eye is open or when you move your eye.  A feeling of something stuck in your eye.  Having trouble keeping your eye open, or not being able to keep it open.  Tearing and redness.  Sensitivity to light.  Blurred vision.  Headache. How is this diagnosed? This condition may be diagnosed based on:  Your medical history.  Your symptoms.  An eye exam. You may work with a health care provider who specializes in diseases and conditions of the eye (ophthalmologist). Before the eye exam, numbing drops may be put into your eye. You may also have dye put in your eye with a dropper or a small paper strip. The dye makes the abrasion easy to see when your ophthalmologist examines your eye with a light. Your ophthalmologist may look at your eye through an eye scope (slit lamp). How is this treated? Treatment may vary depending on the cause of your condition, and it may include:  Washing out your eye.  Removing any foreign body.  Antibiotic drops or ointment to treat an infection.  Steroid drops or ointment to treat redness, irritation, or inflammation.  Pain medicine.  An eye patch to keep your eye closed. Follow these instructions at home: Medicines  Use eye drops or ointments as told by your eye care provider.  If you were prescribed antibiotic drops or ointment, use them as told by your eye care provider. Do not stop using the antibiotic even if you start to feel better.  Take over-the-counter and prescription medicines only as told by your eye care provider.  Do not drive or use heavy machinery while taking prescription pain medicine. General instructions  If you have an eye patch, wear it as told by  your eye care provider. ? Do not drive or use machinery while wearing an eye patch. Your ability to judge distances will be impaired. ? Follow instructions from your eye care provider about when to remove the patch.  Ask your eye care provider whether you can use a cold, wet cloth (compress) on your eye to relieve pain.  Do not rub or touch your eye. Do not wash out your eye.  Do not wear contact lenses until your eye care provider says that this is okay.  Avoid bright light and eye strain.  Keep all follow-up visits as told by your eye care provider. This is important for preventing infection and vision loss. Contact a health care provider if:  You continue to have eye pain and other symptoms for more than 2 days.  You develop new symptoms, such as redness, tearing, or discharge.  You have discharge that makes your eyelids stick together in the morning.  Your eye patch becomes so loose that you can blink your eye.  Symptoms return after the original abrasion has healed. Get help right away if:  You have severe eye pain that does not get better with medicine.  You have vision loss. Summary  A corneal abrasion is a scratch on the outer layer of the clear covering over the front of your eye (cornea).  Corneal abrasion can cause eye pain, redness, tearing, and blurred vision.  This condition is usually treated with medicine to prevent infection and scarring. You also may have to wear an eye patch to cover your eye.  Let your eye care provider know if your symptoms continue for more than 2 days. This information is not intended to replace advice given to you by your health care provider. Make sure you discuss any questions you have with your health care provider. Document Released: 01/24/2000 Document Revised: 01/07/2016 Document Reviewed: 01/07/2016 Elsevier Interactive Patient Education  2019 [employer].

## 2018-05-20 NOTE — Progress Notes (Addendum)
Established Patient Office Visit  Subjective:  Patient ID: Madison Kent, female    DOB: [birth year]  Age: 33 y.o. MRN: [id number]  CC:  Chief Complaint  Patient presents with  . Eye Problem    hot oil splashed in left eye on 05/18/18. Left eye is still painful. Head pressure on the left side of head    HPI Madison Kent presents for   Pt reports that she was frying chicken 2 days ago when the oil popped and burned her left eye She had pain initially so she flushed it with room temperature water She almost immediately had a headache She denies any vision changes and has not been using any drops She feels like her head is hurting along the left temple     Past Medical History:  Diagnosis Date  . Allergy   . Anemia   . Headache    Sinus  . Sinusitis nasal     Past Surgical History:  Procedure Laterality Date  . DILITATION & CURRETTAGE/HYSTROSCOPY WITH VERSAPOINT RESECTION N/A 01/22/2015   Procedure: DILATATION & CURETTAGE/HYSTEROSCOPY WITH VERSAPOINT RESECTION;  Surgeon: Genia Del, MD;  Location: [facility];  Service: Gynecology;  Laterality: N/A;  . FINGER SURGERY    . NO PAST SURGERIES      Family History  Problem Relation Age of Onset  . Cancer Father 38       Liver cancer- Hepatitis B  . Diabetes Father   . Hypertension Father   . Sickle cell trait Mother   . Colon cancer Neg Hx     Social History   Socioeconomic History  . Marital status: Single    Spouse name: Not on file  . Number of children: Not on file  . Years of education: Not on file  . Highest education level: Not on file  Occupational History  . Not on file  Social Needs  . Financial resource strain: Not on file  . Food insecurity:    Worry: Not on file    Inability: Not on file  . Transportation needs:    Medical: Not on file    Non-medical: Not on file  Tobacco Use  . Smoking status: Never Smoker  . Smokeless tobacco: Never Used  Substance and Sexual Activity  .  Alcohol use: No  . Drug use: No  . Sexual activity: Yes    Birth control/protection: None  Lifestyle  . Physical activity:    Days per week: Not on file    Minutes per session: Not on file  . Stress: Not on file  Relationships  . Social connections:    Talks on phone: Not on file    Gets together: Not on file    Attends religious service: Not on file    Active member of club or organization: Not on file    Attends meetings of clubs or organizations: Not on file    Relationship status: Not on file  . Intimate partner violence:    Fear of current or ex partner: Not on file    Emotionally abused: Not on file    Physically abused: Not on file    Forced sexual activity: Not on file  Other Topics Concern  . Not on file  Social History Narrative  . Not on file    Outpatient Medications Prior to Visit  Medication Sig Dispense Refill  . acetic acid 2 % otic solution Place 4 drops into both ears 4 (four) times daily. 15 mL  0  . fluticasone (FLONASE) 50 MCG/ACT nasal spray Place 1 spray into both nostrils 2 (two) times daily. 16 g 6  . Multiple Vitamins-Minerals (HAIR/SKIN/NAILS PO) Place 2 tablets under the tongue daily.     No facility-administered medications prior to visit.     No Known Allergies  ROS Review of Systems    Objective:    Physical Exam  Constitutional: She appears well-developed and well-nourished.  HENT:  Head: Normocephalic and atraumatic.  Eyes: Pupils are equal, round, and reactive to light. Conjunctivae, EOM and lids are normal. Right eye exhibits no chemosis, no discharge and no exudate. No foreign body present in the right eye. Left eye exhibits no chemosis, no discharge and no exudate. No foreign body present in the left eye. Right pupil is round and reactive. Left pupil is round and reactive.  Fundoscopic exam:      The right eye shows no AV nicking, no exudate, no hemorrhage and no papilledema.       The left eye shows no AV nicking, no exudate, no  hemorrhage and no papilledema.  Slit lamp exam:      The right eye shows no corneal abrasion.       The left eye shows corneal abrasion.    Pulmonary/Chest: Effort normal.    BP 110/69   Temp 98.5 F (36.9 C) (Oral)   Ht 5\' 9"  (1.753 m)   Wt 171 lb 6.4 oz (77.7 kg)   SpO2 97%   BMI 25.31 kg/m  Wt Readings from Last 3 Encounters:  05/20/18 171 lb 6.4 oz (77.7 kg)  09/16/17 164 lb 12.8 oz (74.8 kg)  08/04/17 166 lb 12.8 oz (75.7 kg)     There are no preventive care reminders to display for this patient.  There are no preventive care reminders to display for this patient.  Lab Results  Component Value Date   TSH 1.400 08/04/2017   Lab Results  Component Value Date   WBC 6.8 08/04/2017   HGB 12.7 08/04/2017   HCT 38.2 08/04/2017   MCV 71 (L) 08/04/2017   PLT 283 08/04/2017   Lab Results  Component Value Date   NA 139 08/04/2017   K 4.1 08/04/2017   CO2 24 08/04/2017   GLUCOSE 99 08/04/2017   BUN 7 08/04/2017   CREATININE 0.62 08/04/2017   BILITOT 0.3 08/04/2017   ALKPHOS 46 08/04/2017   AST 11 08/04/2017   ALT 5 08/04/2017   PROT 7.1 08/04/2017   ALBUMIN 4.6 08/04/2017   CALCIUM 9.7 08/04/2017   Lab Results  Component Value Date   CHOL 112 08/04/2017   Lab Results  Component Value Date   HDL 48 08/04/2017   Lab Results  Component Value Date   LDLCALC 56 08/04/2017   Lab Results  Component Value Date   TRIG 40 08/04/2017   Lab Results  Component Value Date   CHOLHDL 2.3 08/04/2017   No results found for: HGBA1C    Assessment & Plan:   Problem List Items Addressed This Visit    None    Visit Diagnoses    Left eye pain    -  Primary   Eye injury, initial encounter       Relevant Orders   Visual acuity screening    Normal visual acuity Visible abrasion with UV light exam Discussed topical opth ketorolac Supportive care Handout provided Wear sunglasses  Meds ordered this encounter  Medications  . ketorolac (ACULAR) 0.5 %  ophthalmic solution  Sig: Place 1 drop into the left eye 4 (four) times daily.    Dispense:  5 mL    Refill:  0    Follow-up: No follow-ups on file.    Doristine Bosworth, MD

## 2018-05-20 NOTE — Telephone Encounter (Signed)
Pt called on the COVID line stating that she was calling back to make an appointment; the pt says that the office was supposed to call her back this morning, but she decided to call them instead; conference call initiated with Gearldine Bienenstock at [facility]; she will assist the pt in scheduling appointment; pt offered and accepted in office visit 05/20/2018 with Dr Eldred Manges; the pt verbalized understanding; will route to office for notification.

## 2018-08-16 ENCOUNTER — Other Ambulatory Visit: Payer: Self-pay

## 2018-08-16 ENCOUNTER — Ambulatory Visit: Payer: 59 | Admitting: Family Medicine

## 2018-08-16 ENCOUNTER — Encounter: Payer: Self-pay | Admitting: Family Medicine

## 2018-08-16 VITALS — BP 110/72 | HR 85 | Temp 97.4°F | Ht 69.0 in | Wt 175.0 lb

## 2018-08-16 DIAGNOSIS — R1011 Right upper quadrant pain: Secondary | ICD-10-CM | POA: Diagnosis not present

## 2018-08-16 DIAGNOSIS — G8929 Other chronic pain: Secondary | ICD-10-CM | POA: Diagnosis not present

## 2018-08-16 DIAGNOSIS — R002 Palpitations: Secondary | ICD-10-CM

## 2018-08-16 DIAGNOSIS — R590 Localized enlarged lymph nodes: Secondary | ICD-10-CM

## 2018-08-16 MED ORDER — AMOXICILLIN-POT CLAVULANATE 875-125 MG PO TABS: 1.0000 | ORAL_TABLET | Freq: Two times a day (BID) | ORAL | 0 refills | Status: DC

## 2018-08-16 NOTE — Patient Instructions (Addendum)
  Pt with use hydrocortisone cream for irritation in the ears NO cotton tip applicators Take antibiotics with food to avoid stomach upset Call on labwork    If you have lab work done today you will be contacted with your lab results within the next 2 weeks.  If you have not heard from [place] then please contact us. The fastest way to get your results is to register for My Chart.   IF you received an x-ray today, you will receive an invoice from [facility]. Please contact [facility] at [phone] with questions or concerns regarding your invoice.   IF you received labwork today, you will receive an invoice from [place]. Please contact [facility] at [phone] with questions or concerns regarding your invoice.   Our billing staff will not be able to assist you with questions regarding bills from these companies.  You will be contacted with the lab results as soon as they are available. The fastest way to get your results is to activate your My Chart account. Instructions are located on the last page of this paperwork. If you have not heard from [place] regarding the results in 2 weeks, please contact this office.

## 2018-08-16 NOTE — Progress Notes (Signed)
Acute Office Visit  Subjective:    Patient ID: Madison Kent, female    DOB: [birth year], 33 y.o.   MRN: [id number]  Chief Complaint  Patient presents with  . Generalized Body Aches  . left side    doesnt feel right   . Tachycardia    HPI Patient is in today for  body aches noted worse of the right side -worse when pt lays on the left side. Pt states she feels inside of her body is hurting. No fever, no chills, no nausea or  No vomiting. Pt states heart is beating fast intermittently but not at the same time as her body aches. Pt states she has not experienced weight loss or SOB. Pt with concern for liver-asks for lft to be completed. Pt with concern for neck pain with difficulty swallowing-worse on the right.  Pt states she had ear pain that has now  resolved-itchy. External canal   Past Medical History:  Diagnosis Date  . Allergy   . Anemia   . Headache    Sinus  . Sinusitis nasal     Past Surgical History:  Procedure Laterality Date  . DILITATION & CURRETTAGE/HYSTROSCOPY WITH VERSAPOINT RESECTION N/A 01/22/2015   Procedure: DILATATION & CURETTAGE/HYSTEROSCOPY WITH VERSAPOINT RESECTION;  Surgeon: Princess Bruins, MD;  Location: [facility];  Service: Gynecology;  Laterality: N/A;  . FINGER SURGERY    . NO PAST SURGERIES      Family History  Problem Relation Age of Onset  . Cancer Father 41       Liver cancer- Hepatitis B  . Diabetes Father   . Hypertension Father   . Sickle cell trait Mother   . Colon cancer Neg Hx     Social History   Socioeconomic History  . Marital status: Single    Spouse name: Not on file  . Number of children: Not on file  . Years of education: Not on file  . Highest education level: Not on file  Occupational History  . Not on file  Social Needs  . Financial resource strain: Not on file  . Food insecurity    Worry: Not on file    Inability: Not on file  . Transportation needs    Medical: Not on file    Non-medical: Not on file   Tobacco Use  . Smoking status: Never Smoker  . Smokeless tobacco: Never Used  Substance and Sexual Activity  . Alcohol use: No  . Drug use: No  . Sexual activity: Yes    Birth control/protection: None  Lifestyle  . Physical activity    Days per week: Not on file    Minutes per session: Not on file  . Stress: Not on file  Relationships  . Social Herbalist on phone: Not on file    Gets together: Not on file    Attends religious service: Not on file    Active member of club or organization: Not on file    Attends meetings of clubs or organizations: Not on file    Relationship status: Not on file  . Intimate partner violence    Fear of current or ex partner: Not on file    Emotionally abused: Not on file    Physically abused: Not on file    Forced sexual activity: Not on file  Other Topics Concern  . Not on file  Social History Narrative  . Not on file    Outpatient Medications Prior to  Visit  Medication Sig Dispense Refill  . cholecalciferol (VITAMIN D3) 25 MCG (1000 UT) tablet Take 1,000 Units by mouth daily.    . Prenatal Vit-Fe Fumarate-FA (PRENATAL MULTIVITAMIN) TABS tablet Take 1 tablet by mouth daily at 12 noon.    . vitamin E 1000 UNIT capsule Take 1,000 Units by mouth daily.    . acetic acid 2 % otic solution Place 4 drops into both ears 4 (four) times daily. 15 mL 0  . fluticasone (FLONASE) 50 MCG/ACT nasal spray Place 1 spray into both nostrils 2 (two) times daily. 16 g 6  . ketorolac (ACULAR) 0.5 % ophthalmic solution Place 1 drop into the left eye 4 (four) times daily. 5 mL 0  . Multiple Vitamins-Minerals (HAIR/SKIN/NAILS PO) Place 2 tablets under the tongue daily.     No facility-administered medications prior to visit.     No Known Allergies  Review of Systems  Constitutional: Positive for fever and malaise/fatigue. Negative for chills.  HENT: Positive for congestion, ear pain and sore throat. Negative for ear discharge, hearing loss and sinus  pain.   Eyes: Negative for double vision.  Respiratory: Negative for cough.   Cardiovascular: Positive for palpitations.  Gastrointestinal: Positive for abdominal pain, constipation and nausea. Negative for blood in stool and diarrhea.  Genitourinary: Negative for dysuria, frequency, hematuria and urgency.  Musculoskeletal: Positive for myalgias and neck pain.  Skin: Positive for itching. Negative for rash.  Neurological: Negative for dizziness and headaches.   Weight gain 10 lbs    Objective:    Physical Exam  Constitutional: She is oriented to person, place, and time. She appears well-developed and well-nourished. No distress.  HENT:  Head: Normocephalic and atraumatic.  Nose: Nose normal.  Mouth/Throat: Oropharynx is clear and moist. No oropharyngeal exudate.  Eyes: Conjunctivae are normal.  Cardiovascular: Normal rate and regular rhythm.  Pulmonary/Chest: Effort normal and breath sounds normal.  Abdominal: Soft. Bowel sounds are normal.  Musculoskeletal: Normal range of motion.  Lymphadenopathy:    She has cervical adenopathy.  Neurological: She is alert and oriented to person, place, and time.    BP 110/72 (BP Location: Right Arm, Patient Position: Sitting, Cuff Size: Normal)   Pulse 85   Temp (!) 97.4 F (36.3 C) (Oral)   Ht 5' 9" (1.753 m)   Wt 175 lb (79.4 kg)   LMP 07/23/2018   SpO2 96%   BMI 25.84 kg/m  Wt Readings from Last 3 Encounters:  08/16/18 175 lb (79.4 kg)  05/20/18 171 lb 6.4 oz (77.7 kg)  09/16/17 164 lb 12.8 oz (74.8 kg)    Lab Results  Component Value Date   TSH 1.400 08/04/2017   Lab Results  Component Value Date   WBC 6.8 08/04/2017   HGB 12.7 08/04/2017   HCT 38.2 08/04/2017   MCV 71 (L) 08/04/2017   PLT 283 08/04/2017   Lab Results  Component Value Date   NA 139 08/04/2017   K 4.1 08/04/2017   CO2 24 08/04/2017   GLUCOSE 99 08/04/2017   BUN 7 08/04/2017   CREATININE 0.62 08/04/2017   BILITOT 0.3 08/04/2017   ALKPHOS 46  08/04/2017   AST 11 08/04/2017   ALT 5 08/04/2017   PROT 7.1 08/04/2017   ALBUMIN 4.6 08/04/2017   CALCIUM 9.7 08/04/2017   Lab Results  Component Value Date   CHOL 112 08/04/2017   Lab Results  Component Value Date   HDL 48 08/04/2017   Lab Results  Component Value Date     LDLCALC 56 08/04/2017   Lab Results  Component Value Date   TRIG 40 08/04/2017   Lab Results  Component Value Date   CHOLHDL 2.3 08/04/2017   No results found for: HGBA1C     Assessment & Plan:   1. Cervical lymphadenopathy augmentin-rx, f/u in 2 weeks to recheck lymph node enlargement and review labwork - CBC with Differential - Sedimentation Rate - POCT urinalysis dipstick - Urine Culture ENT if unresolved 2. Abdominal pain, chronic, right upper quadrant labwork pending - CMP14+EGFR 3. Palpitations Pt to go to ER if CP/SOB or additional concerns when experiencing palpitations - TSH Otitis external -avoid cotton tip applicators-use hydrocortisone cream for external ear irritaition LISA LEIGH CORUM, MD 

## 2018-08-17 LAB — CMP14+EGFR
ALT: 11 IU/L (ref 0–32)
AST: 8 IU/L (ref 0–40)
Albumin/Globulin Ratio: 1.9 (ref 1.2–2.2)
Albumin: 4.5 g/dL (ref 3.8–4.8)
Alkaline Phosphatase: 45 IU/L (ref 39–117)
BUN/Creatinine Ratio: 14 (ref 9–23)
BUN: 9 mg/dL (ref 6–20)
Bilirubin Total: <0.2 mg/dL (ref 0.0–1.2)
CO2: 20 mmol/L (ref 20–29)
Calcium: 9.5 mg/dL (ref 8.7–10.2)
Chloride: 100 mmol/L (ref 96–106)
Creatinine, Ser: 0.65 mg/dL (ref 0.57–1.00)
GFR calc Af Amer: 137 mL/min/{1.73_m2} (ref >59)
GFR calc non Af Amer: 119 mL/min/{1.73_m2} (ref >59)
Globulin, Total: 2.4 g/dL (ref 1.5–4.5)
Glucose: 96 mg/dL (ref 65–99)
Potassium: 3.9 mmol/L (ref 3.5–5.2)
Sodium: 139 mmol/L (ref 134–144)
Total Protein: 6.9 g/dL (ref 6.0–8.5)

## 2018-08-17 LAB — CBC WITH DIFFERENTIAL/PLATELET
Basophils Absolute: 0.1 10*3/uL (ref 0.0–0.2)
Basos: 1 %
EOS (ABSOLUTE): 0.2 10*3/uL (ref 0.0–0.4)
Eos: 2 %
Hematocrit: 40.4 % (ref 34.0–46.6)
Hemoglobin: 12 g/dL (ref 11.1–15.9)
Immature Grans (Abs): 0 10*3/uL (ref 0.0–0.1)
Immature Granulocytes: 0 %
Lymphocytes Absolute: 2.2 10*3/uL (ref 0.7–3.1)
Lymphs: 29 %
MCH: 21.8 pg — ABNORMAL LOW (ref 26.6–33.0)
MCHC: 29.7 g/dL — ABNORMAL LOW (ref 31.5–35.7)
MCV: 73 fL — ABNORMAL LOW (ref 79–97)
Monocytes Absolute: 0.5 10*3/uL (ref 0.1–0.9)
Monocytes: 6 %
Neutrophils Absolute: 4.8 10*3/uL (ref 1.4–7.0)
Neutrophils: 62 %
Platelets: 336 10*3/uL (ref 150–450)
RBC: 5.51 x10E6/uL — ABNORMAL HIGH (ref 3.77–5.28)
RDW: 14.6 % (ref 11.7–15.4)
WBC: 7.8 10*3/uL (ref 3.4–10.8)

## 2018-08-17 LAB — URINE CULTURE: Organism ID, Bacteria: NO GROWTH

## 2018-08-17 LAB — URINALYSIS, MICROSCOPIC ONLY: Casts: NONE SEEN /lpf

## 2018-08-17 LAB — TSH: TSH: 1.6 u[IU]/mL (ref 0.450–4.500)

## 2018-08-17 LAB — SEDIMENTATION RATE: Sed Rate: 6 mm/hr (ref 0–32)

## 2018-08-19 NOTE — Progress Notes (Signed)
Spoke with pt, no follow up needed at this time

## 2018-08-30 ENCOUNTER — Ambulatory Visit (INDEPENDENT_AMBULATORY_CARE_PROVIDER_SITE_OTHER): Payer: 59

## 2018-08-30 ENCOUNTER — Inpatient Hospital Stay: Admission: RE | Admit: 2018-08-30 | Payer: Self-pay | Source: Ambulatory Visit

## 2018-08-30 ENCOUNTER — Other Ambulatory Visit: Payer: Self-pay

## 2018-08-30 ENCOUNTER — Encounter: Payer: Self-pay | Admitting: Family Medicine

## 2018-08-30 ENCOUNTER — Ambulatory Visit: Payer: 59 | Admitting: Family Medicine

## 2018-08-30 VITALS — BP 118/77 | HR 77 | Temp 97.7°F | Ht 70.0 in | Wt 175.6 lb

## 2018-08-30 DIAGNOSIS — R059 Cough, unspecified: Secondary | ICD-10-CM | POA: Insufficient documentation

## 2018-08-30 DIAGNOSIS — R490 Dysphonia: Secondary | ICD-10-CM

## 2018-08-30 DIAGNOSIS — R05 Cough: Secondary | ICD-10-CM

## 2018-08-30 LAB — POCT URINE PREGNANCY: Preg Test, Ur: NEGATIVE

## 2018-08-30 NOTE — Patient Instructions (Signed)
Pt to see ENT per request-suggest taking zyrtec daily for allergy symptoms-pt can pick up this medication at the pharmacy Chest xray completed today-will call when read available Suggest trial of prilosec daily for possible reflux-pt can pick up medication at the pharmacy

## 2018-08-30 NOTE — Progress Notes (Signed)
Acute Office Visit  Subjective:    Patient ID: Madison AuerbachZara Mahaman Garba, female    DOB: [birth year], 33 y.o.   MRN: [id number]  Chief Complaint  Patient presents with  . Follow-up    cervial lymphadenopathy    HPI Patient is in today for cervical lymphadenopathy-improved. Pt now concerned with  hoarseness-drainage noted by pt intermittently. No h/o of allergies. Pt with cough intermittently. Non productive. pt states she had an infection in [place]where she is from[place] that was not evaluated by a physician. Pt states she was checked for TB-negative on arrival in the [place].  cxr in the past with negative for infection.  Pt has not tried antihistamines or PPI. Pt requests referral to ENT due to concerns about swallowing, lymph node enlargement  Past Medical History:  Diagnosis Date  . Allergy   . Anemia   . Headache    Sinus  . Sinusitis nasal     Past Surgical History:  Procedure Laterality Date  . DILITATION & CURRETTAGE/HYSTROSCOPY WITH VERSAPOINT RESECTION N/A 01/22/2015   Procedure: DILATATION & CURETTAGE/HYSTEROSCOPY WITH VERSAPOINT RESECTION;  Surgeon: Genia DelMarie-Lyne Lavoie, MD;  Location: [facility];  Service: Gynecology;  Laterality: N/A;  . FINGER SURGERY    . NO PAST SURGERIES      Family History  Problem Relation Age of Onset  . Cancer Father [id number]       Liver cancer- Hepatitis B  . Diabetes Father   . Hypertension Father   . Sickle cell trait Mother   . Colon cancer Neg Hx     Social History   Socioeconomic History  . Marital status: Single    Spouse name: Not on file  . Number of children: Not on file  . Years of education: Not on file  . Highest education level: Not on file  Occupational History  . Not on file  Social Needs  . Financial resource strain: Not on file  . Food insecurity    Worry: Not on file    Inability: Not on file  . Transportation needs    Medical: Not on file    Non-medical: Not on file  Tobacco Use  . Smoking status: Never Smoker  . Smokeless  tobacco: Never Used  Substance and Sexual Activity  . Alcohol use: No  . Drug use: No  . Sexual activity: Yes    Birth control/protection: None  Lifestyle  . Physical activity    Days per week: Not on file    Minutes per session: Not on file  . Stress: Not on file  Relationships  . Social Musicianconnections    Talks on phone: Not on file    Gets together: Not on file    Attends religious service: Not on file    Active member of club or organization: Not on file    Attends meetings of clubs or organizations: Not on file    Relationship status: Not on file  . Intimate partner violence    Fear of current or ex partner: Not on file    Emotionally abused: Not on file    Physically abused: Not on file    Forced sexual activity: Not on file  Other Topics Concern  . Not on file  Social History Narrative  . Not on file    Outpatient Medications Prior to Visit  Medication Sig Dispense Refill  . amoxicillin-clavulanate (AUGMENTIN) 875-125 MG tablet Take 1 tablet by mouth 2 (two) times daily. 20 tablet 0  . cholecalciferol (VITAMIN D3)  25 MCG (1000 UT) tablet Take 1,000 Units by mouth daily.    . Prenatal Vit-Fe Fumarate-FA (PRENATAL MULTIVITAMIN) TABS tablet Take 1 tablet by mouth daily at 12 noon.    . vitamin E 1000 UNIT capsule Take 1,000 Units by mouth daily.     No facility-administered medications prior to visit.     No Known Allergies  Review of Systems  Constitutional: Negative for chills, fever, malaise/fatigue and weight loss.  HENT: Positive for congestion. Negative for sore throat.   Respiratory: Positive for cough. Negative for sputum production, shortness of breath and wheezing.   Musculoskeletal: Negative for neck pain.  Neurological: Negative for headaches.       Objective:    Physical Exam  Constitutional: She appears well-developed and well-nourished. No distress.  HENT:  Head: Normocephalic and atraumatic.  Right Ear: External ear normal.  Left Ear: External  ear normal.  Nose: Nose normal.  Mouth/Throat: Oropharynx is clear and moist. No oropharyngeal exudate.  Cardiovascular: Normal rate and regular rhythm.  Pulmonary/Chest: Effort normal and breath sounds normal.    BP 118/77 (BP Location: Left Arm, Patient Position: Sitting, Cuff Size: Normal)   Pulse 77   Temp 97.7 F (36.5 C) (Oral)   Ht 5\' 10"  (1.778 m)   Wt 175 lb 9.6 oz (79.7 kg)   SpO2 98%   BMI 25.20 kg/m  Wt Readings from Last 3 Encounters:  08/30/18 175 lb 9.6 oz (79.7 kg)  08/16/18 175 lb (79.4 kg)  05/20/18 171 lb 6.4 oz (77.7 kg)    Lab Results  Component Value Date   TSH 1.600 08/16/2018   Lab Results  Component Value Date   WBC 7.8 08/16/2018   HGB 12.0 08/16/2018   HCT 40.4 08/16/2018   MCV 73 (L) 08/16/2018   PLT 336 08/16/2018   Lab Results  Component Value Date   NA 139 08/16/2018   K 3.9 08/16/2018   CO2 20 08/16/2018   GLUCOSE 96 08/16/2018   BUN 9 08/16/2018   CREATININE 0.65 08/16/2018   BILITOT <0.2 08/16/2018   ALKPHOS 45 08/16/2018   AST 8 08/16/2018   ALT 11 08/16/2018   PROT 6.9 08/16/2018   ALBUMIN 4.5 08/16/2018   CALCIUM 9.5 08/16/2018   Lab Results  Component Value Date   CHOL 112 08/04/2017   Lab Results  Component Value Date   HDL 48 08/04/2017   Lab Results  Component Value Date   LDLCALC 56 08/04/2017   Lab Results  Component Value Date   TRIG 40 08/04/2017   Lab Results  Component Value Date   CHOLHDL 2.3 08/04/2017       Assessment & Plan:   1. Cough - Ambulatory referral to ENT - POCT urine pregnancy - DG Chest 2 View-requested due to chronic cough-cxr normal Pregnancy neg for cxr 2. Hoarseness of voice ENT referral-pt requested due to hoarseness, difficulty swallowing and concern for lymph nodes D/w pt concern for allergy and requested trial of zyrtec-pt declined trial of antihistamine D/w pt concern for reflux as possible trigger-pt declined PPI   Hannah Beat, MD

## 2019-11-29 ENCOUNTER — Emergency Department (HOSPITAL_COMMUNITY): Payer: Self-pay

## 2019-11-29 ENCOUNTER — Other Ambulatory Visit: Payer: Self-pay

## 2019-11-29 ENCOUNTER — Encounter (HOSPITAL_COMMUNITY): Payer: Self-pay | Admitting: Emergency Medicine

## 2019-11-29 ENCOUNTER — Emergency Department (HOSPITAL_COMMUNITY)
Admission: EM | Admit: 2019-11-29 | Discharge: 2019-11-29 | Disposition: A | Discharge status: Home / Self Care | Payer: Self-pay | Attending: Emergency Medicine | Admitting: Emergency Medicine

## 2019-11-29 DIAGNOSIS — S29011A Strain of muscle and tendon of front wall of thorax, initial encounter: Secondary | ICD-10-CM | POA: Insufficient documentation

## 2019-11-29 DIAGNOSIS — T148XXA Other injury of unspecified body region, initial encounter: Secondary | ICD-10-CM

## 2019-11-29 DIAGNOSIS — R10813 Right lower quadrant abdominal tenderness: Secondary | ICD-10-CM | POA: Insufficient documentation

## 2019-11-29 DIAGNOSIS — X58XXXA Exposure to other specified factors, initial encounter: Secondary | ICD-10-CM | POA: Insufficient documentation

## 2019-11-29 DIAGNOSIS — Z20822 Contact with and (suspected) exposure to covid-19: Secondary | ICD-10-CM | POA: Insufficient documentation

## 2019-11-29 DIAGNOSIS — Z800 Family history of malignant neoplasm of digestive organs: Secondary | ICD-10-CM | POA: Insufficient documentation

## 2019-11-29 LAB — COMPREHENSIVE METABOLIC PANEL
ALT: 10 U/L (ref 0–44)
AST: 16 U/L (ref 15–41)
Albumin: 4 g/dL (ref 3.5–5.0)
Alkaline Phosphatase: 37 U/L — ABNORMAL LOW (ref 38–126)
Anion gap: 10 (ref 5–15)
BUN: <5 mg/dL — ABNORMAL LOW (ref 6–20)
CO2: 24 mmol/L (ref 22–32)
Calcium: 9.3 mg/dL (ref 8.9–10.3)
Chloride: 103 mmol/L (ref 98–111)
Creatinine, Ser: 0.63 mg/dL (ref 0.44–1.00)
GFR, Estimated: >60 mL/min (ref >60)
Glucose, Bld: 92 mg/dL (ref 70–99)
Potassium: 3.5 mmol/L (ref 3.5–5.1)
Sodium: 137 mmol/L (ref 135–145)
Total Bilirubin: 0.5 mg/dL (ref 0.3–1.2)
Total Protein: 7.4 g/dL (ref 6.5–8.1)

## 2019-11-29 LAB — URINALYSIS, ROUTINE W REFLEX MICROSCOPIC
Bilirubin Urine: NEGATIVE
Glucose, UA: NEGATIVE mg/dL
Hgb urine dipstick: NEGATIVE
Ketones, ur: NEGATIVE mg/dL
Leukocytes,Ua: NEGATIVE
Nitrite: NEGATIVE
Protein, ur: NEGATIVE mg/dL
Specific Gravity, Urine: 1.014 (ref 1.005–1.030)
pH: 5 (ref 5.0–8.0)

## 2019-11-29 LAB — CBC
HCT: 40.2 % (ref 36.0–46.0)
Hemoglobin: 12.1 g/dL (ref 12.0–15.0)
MCH: 22.1 pg — ABNORMAL LOW (ref 26.0–34.0)
MCHC: 30.1 g/dL (ref 30.0–36.0)
MCV: 73.5 fL — ABNORMAL LOW (ref 80.0–100.0)
Platelets: 386 10*3/uL (ref 150–400)
RBC: 5.47 MIL/uL — ABNORMAL HIGH (ref 3.87–5.11)
RDW: 15.6 % — ABNORMAL HIGH (ref 11.5–15.5)
WBC: 7 10*3/uL (ref 4.0–10.5)
nRBC: 0 % (ref 0.0–0.2)

## 2019-11-29 LAB — RESPIRATORY PANEL BY RT PCR (FLU A&B, COVID)
Influenza A by PCR: NEGATIVE
Influenza B by PCR: NEGATIVE
SARS Coronavirus 2 by RT PCR: NEGATIVE

## 2019-11-29 LAB — I-STAT BETA HCG BLOOD, ED (MC, WL, AP ONLY): I-stat hCG, quantitative: <5 m[IU]/mL (ref <5)

## 2019-11-29 LAB — LIPASE, BLOOD: Lipase: 24 U/L (ref 11–51)

## 2019-11-29 LAB — D-DIMER, QUANTITATIVE: D-Dimer, Quant: <0.27 ug/mL-FEU (ref 0.00–0.50)

## 2019-11-29 LAB — TROPONIN I (HIGH SENSITIVITY): Troponin I (High Sensitivity): <2 ng/L (ref <18)

## 2019-11-29 MED ORDER — IOHEXOL 300 MG/ML  SOLN
100.0000 mL | Freq: Once | INTRAMUSCULAR | Status: AC | PRN
  Administered 2019-11-29: 100 mL via INTRAVENOUS

## 2019-11-29 MED ORDER — FENTANYL CITRATE (PF) 100 MCG/2ML IJ SOLN
50.0000 ug | Freq: Once | INTRAMUSCULAR | Status: AC
  Administered 2019-11-29: 50 ug via INTRAVENOUS
  Filled 2019-11-29: qty 2

## 2019-11-29 MED ORDER — SODIUM CHLORIDE 0.9 % IV BOLUS
1000.0000 mL | Freq: Once | INTRAVENOUS | Status: AC
  Administered 2019-11-29: 1000 mL via INTRAVENOUS

## 2019-11-29 NOTE — ED Notes (Signed)
Pt d/c home per MD order. Discharge summary reviewed with pt, pt verbalizes understanding. No s/s of acute distress noted. Reports discharge ride home.  

## 2019-11-29 NOTE — ED Provider Notes (Signed)
[facility] EMERGENCY DEPARTMENT Provider Note   CSN: [id number] Arrival date & time: 11/29/19  0730     History Chief Complaint  Patient presents with  . Chest Pain  . Abdominal Pain    Madison Kent is a 34 y.o. female with no pertinent past medical history that presents the emergency department today for multiple complaints.  Patient states that a week ago she started having right-sided pain, states that mainly her right side of her chest hurts which has started to radiate to the left side, worse when she takes a deep breath.  Not worse with exertion. Also admits to some shortness of breath when this occurs.  Patient also admits to some right lower quadrant abdominal pain, radiates to her back.  Also some epigastric and umbilical pain.  Cramping sensation.  No trauma to any of these areas.  No fevers, chills, nausea, vomiting, diarrhea. Denies any dysuria, hematuria, vaginal pain, vaginal discharge, vaginal bleeding.  Denies any chance of pregnancy or STD. Patient states that she is really here because her dad died of liver cancer, states that she has been googling her symptoms and she thinks that she also has liver cancer.  States that she wants full body CT scan at this time.  Also mentions that she thinks she has sores on the right side of her mouth, states that she thinks that the right side of her tongue is more sensitive than the left side of her tongue which recently started.  Has been vaccinated against Covid.  Also states that she thinks that her right side of her eye is swollen, however when she looks at her eye there is no swelling.  Denies any eye pain, vision changes, blurry vision.  Denies any headache, confusion, neck pain.  Does not have a PCP.  Has not seen anyone for this.  Has tried taking Tylenol which has not seemed to work.  No cardiac history.  No previous abdominal surgeries.  HPI     Past Medical History:  Diagnosis Date  . Allergy   .  Anemia   . Headache    Sinus  . Sinusitis nasal     Patient Active Problem List   Diagnosis Date Noted  . Cough 08/30/2018  . Hoarseness of voice 08/30/2018  . Abdominal pain, chronic, right upper quadrant 08/16/2018  . Cervical lymphadenopathy 08/16/2018  . Palpitations 08/16/2018  . Seasonal allergies 09/17/2017  . Anemia 12/07/2013  . Irregular menses 12/07/2013    Past Surgical History:  Procedure Laterality Date  . DILITATION & CURRETTAGE/HYSTROSCOPY WITH VERSAPOINT RESECTION N/A 01/22/2015   Procedure: DILATATION & CURETTAGE/HYSTEROSCOPY WITH VERSAPOINT RESECTION;  Surgeon: Genia Del, MD;  Location: [facility];  Service: Gynecology;  Laterality: N/A;  . FINGER SURGERY    . NO PAST SURGERIES       OB History    Gravida  0   Para      Term      Preterm      AB      Living        SAB      TAB      Ectopic      Multiple      Live Births              Family History  Problem Relation Age of Onset  . Cancer Father 68       Liver cancer- Hepatitis B  . Diabetes Father   . Hypertension Father   .  Sickle cell trait Mother   . Colon cancer Neg Hx     Social History   Tobacco Use  . Smoking status: Never Smoker  . Smokeless tobacco: Never Used  Substance Use Topics  . Alcohol use: No  . Drug use: No    Home Medications Prior to Admission medications   Medication Sig Start Date End Date Taking? Authorizing Provider  cholecalciferol (VITAMIN D3) 25 MCG (1000 UT) tablet Take 1,000 Units by mouth daily.   Yes [provider]  Prenatal Vit-Fe Fumarate-FA (PRENATAL MULTIVITAMIN) TABS tablet Take 1 tablet by mouth daily at 12 noon.   Yes [provider]  vitamin E 1000 UNIT capsule Take 1,000 Units by mouth daily.   Yes [provider]    Allergies    Patient has no known allergies.  Review of Systems   Review of Systems  Constitutional: Negative for chills, diaphoresis, fatigue and fever.  HENT: Negative for  congestion, sore throat and trouble swallowing.   Eyes: Negative for pain and visual disturbance.  Respiratory: Negative for cough, shortness of breath and wheezing.   Cardiovascular: Positive for chest pain. Negative for palpitations and leg swelling.  Gastrointestinal: Positive for abdominal pain. Negative for abdominal distention, diarrhea, nausea and vomiting.  Genitourinary: Negative for difficulty urinating.  Musculoskeletal: Negative for back pain, neck pain and neck stiffness.  Skin: Negative for pallor.  Neurological: Negative for dizziness, speech difficulty, weakness and headaches.  Psychiatric/Behavioral: Negative for confusion.    Physical Exam Updated Vital Signs BP 105/66   Pulse 66   Temp 98.2 F (36.8 C) (Oral)   Resp 17   Ht 5\' 9"  (1.753 m)   LMP 11/19/2019   SpO2 100%   BMI 25.93 kg/m   Physical Exam Constitutional:      General: She is not in acute distress.    Appearance: Normal appearance. She is not ill-appearing, toxic-appearing or diaphoretic.  HENT:     Head: Normocephalic and atraumatic.     Mouth/Throat:     Mouth: Mucous membranes are moist.     Pharynx: Oropharynx is clear.     Comments: No lesions inside mouth Eyes:     General: No scleral icterus.    Extraocular Movements: Extraocular movements intact.     Pupils: Pupils are equal, round, and reactive to light.     Comments: No swelling around eyes, normal vision  Cardiovascular:     Rate and Rhythm: Normal rate and regular rhythm.     Pulses: Normal pulses.     Heart sounds: Normal heart sounds.  Pulmonary:     Effort: Pulmonary effort is normal. No respiratory distress.     Breath sounds: Normal breath sounds. No stridor. No wheezing, rhonchi or rales.     Comments: No erythema or rash to suggest shingles Chest:     Chest wall: No tenderness.  Abdominal:     General: Abdomen is flat. There is no distension.     Palpations: Abdomen is soft.     Tenderness: There is abdominal  tenderness in the right lower quadrant, epigastric area and periumbilical area. There is no guarding or rebound. Positive signs include McBurney's sign.  Musculoskeletal:        General: No swelling or tenderness. Normal range of motion.     Cervical back: Normal range of motion and neck supple. No rigidity.     Right lower leg: No edema.     Left lower leg: No edema.  Skin:  General: Skin is warm and dry.     Capillary Refill: Capillary refill takes less than 2 seconds.     Coloration: Skin is not pale.  Neurological:     General: No focal deficit present.     Mental Status: She is alert and oriented to person, place, and time.     Cranial Nerves: No cranial nerve deficit.     Sensory: No sensory deficit.     Motor: No weakness.     Coordination: Coordination normal.  Psychiatric:        Mood and Affect: Mood normal.        Behavior: Behavior normal.     Comments: Anxious     ED Results / Procedures / Treatments   Labs (all labs ordered are listed, but only abnormal results are displayed) Labs Reviewed  CBC - Abnormal; Notable for the following components:      Result Value   RBC 5.47 (*)    MCV 73.5 (*)    MCH 22.1 (*)    RDW 15.6 (*)    All other components within normal limits  COMPREHENSIVE METABOLIC PANEL - Abnormal; Notable for the following components:   BUN <5 (*)    Alkaline Phosphatase 37 (*)    All other components within normal limits  URINALYSIS, ROUTINE W REFLEX MICROSCOPIC - Abnormal; Notable for the following components:   APPearance HAZY (*)    All other components within normal limits  RESPIRATORY PANEL BY RT PCR (FLU A&B, COVID)  D-DIMER, QUANTITATIVE (NOT AT [facility])  LIPASE, BLOOD  I-STAT BETA HCG BLOOD, ED ([facility], [facility], [facility] ONLY)  TROPONIN I (HIGH SENSITIVITY)    EKG EKG Interpretation  Date/Time:  Wednesday November 29 2019 07:44:20 EDT Ventricular Rate:  77 PR Interval:  178 QRS Duration: 84 QT Interval:  372 QTC Calculation: 420 R  Axis:   71 Text Interpretation: Normal sinus rhythm Confirmed by Virgina Norfolk [phone]) on 11/29/2019 8:07:12 AM   Radiology CT Abdomen Pelvis W Contrast  Result Date: 11/29/2019 CLINICAL DATA:  Right lower quadrant pain EXAM: CT ABDOMEN AND PELVIS WITH CONTRAST TECHNIQUE: Multidetector CT imaging of the abdomen and pelvis was performed using the standard protocol following bolus administration of intravenous contrast. CONTRAST:  OMNIPAQUE IOHEXOL 300 MG/ML  SOLN COMPARISON:  None. FINDINGS: Lower chest: Subsegmental atelectasis at the lung bases. Hepatobiliary: No focal liver abnormality is seen. No gallstones, gallbladder wall thickening, or biliary dilatation. Pancreas: Unremarkable. Spleen: Unremarkable. Adrenals/Urinary Tract: Adrenals and kidneys are unremarkable. There is circumferential bladder wall thickening. Stomach/Bowel: Stomach is within normal limits. Bowel is normal in caliber. Normal appendix. Vascular/Lymphatic: No significant vascular findings. No enlarged lymph nodes identified. Reproductive: Uterus is unremarkable.  No adnexal mass. Other: Small volume free fluid in the dependent pelvis is probably physiologic. No abdominal wall hernia. Musculoskeletal: No acute osseous abnormality. IMPRESSION: Normal appendix. Circumferential bladder wall thickening. May be related to underdistention. Correlate for cystitis. Electronically Signed   By: Guadlupe Spanish M.D.   On: 11/29/2019 11:32   DG Chest Port 1 View  Result Date: 11/29/2019 CLINICAL DATA:  Shortness of breath EXAM: PORTABLE CHEST 1 VIEW COMPARISON:  None. FINDINGS: The heart size and mediastinal contours are within normal limits. Both lungs are clear. No pleural effusion or pneumothorax. The visualized skeletal structures are unremarkable. IMPRESSION: No acute process in the chest. Electronically Signed   By: Guadlupe Spanish M.D.   On: 11/29/2019 09:21    Procedures Procedures (including critical care time)  Medications  Ordered in ED Medications  sodium chloride 0.9 % bolus 1,000 mL (0 mLs Intravenous Stopped 11/29/19 1011)  fentaNYL (SUBLIMAZE) injection 50 mcg (50 mcg Intravenous Given 11/29/19 0854)  iohexol (OMNIPAQUE) 300 MG/ML solution 100 mL (100 mLs Intravenous Contrast Given 11/29/19 1119)    ED Course  I have reviewed the triage vital signs and the nursing notes.  Pertinent labs & imaging results that were available during my care of the patient were reviewed by me and considered in my medical decision making (see chart for details).    MDM Rules/Calculators/A&P                         Lillyahna Timoteo GaulMahaman Garba is a 34 y.o. female with no past medical history that presents the emergency department today for multiple complaints.  Patient complaining of right-sided chest pain that is pleuritic in nature with some mild shortness of breath.  Patient appears well, no respiratory distress with normal vitals.  Will obtain ACS work-up including CBC, CMP EKG, chest x-ray, troponin and D-dimer at this time.  Patient also asking for full body CT scan to assess for liver cancer, states that she is having right lower quadrant abdominal pain which started today, physical exam with right lower quadrant tenderness, will obtain CT scan for this reason, however low suspicion since patient is afebrile, appears comfortable and has no nausea.  Patient appears extremely anxious, did explain that we will not get CT of the chest at this time unless D-dimer was positive, patient agreeable.  Initial interventions fluids and fentanyl.  ECG interpreted by me demonstrated NSR.  CXR interpreted by me demonstrated no active cardiopulmonary disease.  Labs demonstrated unremarkable CMP, CBC, urinalysis.  Negative lipase.  D-dimer negative.  First troponin less than 2, do not think that we need repeat troponin at this time, HEAR score 0, patient's pain has been constant for over 24 hours.  CT shows no acute abdominal pathology, bladder  thickening patient is not experiencing any dysuria or hematuria. Urinalysis negative.  Covid test negative.  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  Did express that work-up was unremarkable today and to follow-up with primary care, symptomatic treatment discussed. Strict return precautions given.  Doubt need for further emergent work up at this time. I explained the diagnosis and have given explicit precautions to return to the ER including for any other new or worsening symptoms. The patient understands and accepts the medical plan as it's been dictated and I have answered their questions. Discharge instructions concerning home care and prescriptions have been given. The patient is STABLE and is discharged to home in good condition.  Final Clinical Impression(s) / ED Diagnoses Final diagnoses:  Muscle strain    Rx / DC Orders ED Discharge Orders    None       Farrel Gordonatel, Angell Honse, PA-C 11/29/19 1244    Virgina NorfolkCuratolo, Adam, DO 11/29/19 1537

## 2019-11-29 NOTE — ED Notes (Signed)
Pt transported to CT ?

## 2019-11-29 NOTE — Discharge Instructions (Addendum)
Your work-up today was reassuring including your CT scan and chest x-ray. Think that you are most likely experiencing musculoskeletal pain due to muscle strains, as we discussed. Please use the attached instructions. Use NSAIDs such as Aleve or naproxen over the next 10 days and please follow-up with the primary care in the next couple of days, if you do not have one you can go to [facility] health community health and wellness, other information is above. Please come back to the emergency department for any new or worsening concerning symptoms.

## 2019-11-29 NOTE — ED Triage Notes (Signed)
Pt with multiple complaints since last night.  Reports R sided chest pain, R sided abd pain, painful to breath, sores inside R side of mouth, and states R eye feels swollen but when she looks at it there isn't any swelling.  States R sided chest pain is starting to radiate to L chest.

## 2019-11-29 NOTE — ED Notes (Signed)
Pt blood drawn in triage

## 2020-01-11 ENCOUNTER — Other Ambulatory Visit: Payer: Self-pay | Admitting: Nurse Practitioner

## 2020-01-11 DIAGNOSIS — N644 Mastodynia: Secondary | ICD-10-CM

## 2020-01-11 DIAGNOSIS — N6452 Nipple discharge: Secondary | ICD-10-CM

## 2020-01-17 ENCOUNTER — Telehealth: Payer: Self-pay

## 2020-01-17 NOTE — Telephone Encounter (Signed)
Telephoned patient at home number. Left a voice message with [facility] contact information. 

## 2020-03-11 ENCOUNTER — Other Ambulatory Visit: Payer: Self-pay | Admitting: Obstetrics and Gynecology

## 2020-03-11 ENCOUNTER — Other Ambulatory Visit: Payer: Self-pay

## 2020-03-11 DIAGNOSIS — N644 Mastodynia: Secondary | ICD-10-CM

## 2020-03-11 DIAGNOSIS — M79621 Pain in right upper arm: Secondary | ICD-10-CM

## 2020-03-19 ENCOUNTER — Other Ambulatory Visit (HOSPITAL_COMMUNITY)
Admission: RE | Admit: 2020-03-19 | Discharge: 2020-03-19 | Disposition: A | Discharge status: Home / Self Care | Payer: Medicaid Other | Source: Ambulatory Visit | Attending: Obstetrics and Gynecology | Admitting: Obstetrics and Gynecology

## 2020-03-19 ENCOUNTER — Other Ambulatory Visit: Payer: Self-pay

## 2020-03-19 ENCOUNTER — Ambulatory Visit
Admission: RE | Admit: 2020-03-19 | Discharge: 2020-03-19 | Disposition: A | Discharge status: Home / Self Care | Payer: Medicaid Other | Source: Ambulatory Visit | Attending: Obstetrics and Gynecology | Admitting: Obstetrics and Gynecology

## 2020-03-19 ENCOUNTER — Ambulatory Visit: Admission: RE | Admit: 2020-03-19 | Payer: Medicaid Other | Source: Ambulatory Visit

## 2020-03-19 ENCOUNTER — Ambulatory Visit: Payer: No Typology Code available for payment source | Admitting: *Deleted

## 2020-03-19 ENCOUNTER — Other Ambulatory Visit: Payer: Medicaid Other

## 2020-03-19 VITALS — BP 142/78 | Wt 166.8 lb

## 2020-03-19 DIAGNOSIS — N644 Mastodynia: Secondary | ICD-10-CM

## 2020-03-19 DIAGNOSIS — N6452 Nipple discharge: Secondary | ICD-10-CM | POA: Insufficient documentation

## 2020-03-19 DIAGNOSIS — Z1239 Encounter for other screening for malignant neoplasm of breast: Secondary | ICD-10-CM

## 2020-03-19 NOTE — Progress Notes (Signed)
Ms. Vella Colquitt Mikeal Hawthorne is a 35 y.o. female who presents to [facility] clinic today with complaint of bilateral breast pain and right breast clear discharge when expressed. Patient states the pain comes and goes. Patient stated the pain started 2 years ago and has gotten worse the past 6 months. Patient stated she feels a possible right breast lump that she first noticed 6 months ago. Patient states the pain is greater within the right breast. Patient rates the pain within the right breast a 8 out of 10 and left breast 7 out of 10.    Pap Smear: Pap smear not completed today. Last Pap smear was 08/04/2017 at [facility] and was normal with negative HPV. Per patient has no history of an abnormal Pap smear. Patient stated she has only had the one Pap smear. Last Pap smear result is available in Epic.   Physical exam: Breasts Right breast slightly larger than left breast that per patient has not noticed any changes. No skin abnormalities bilateral breasts. No nipple retraction bilateral breasts. Clear nipple discharge expressed from bilateral breasts. Sample of bilateral breast discharge sent to Cytology for evaluation. No lymphadenopathy. No lumps palpated bilateral breasts.Unable to palpate any lumps in patients area of concern. Complaints of right breast pain between 9 o'clock and 11 o'clock on exam. Complaints of left breast tenderness at 9 o'clock next to areola on exam.       Pelvic/Bimanual Pap is not indicated today per [facility] guidelines.    Smoking History: Patient has never smoked.   Patient Navigation: Patient education provided. Access to services provided for patient through [facility] program.    Breast and Cervical Cancer Risk Assessment: Patient does not have family history of breast cancer, known genetic mutations, or radiation treatment to the chest before age 11. Patient does not have history of cervical dysplasia, immunocompromised, or DES exposure in-utero. Breast cancer risk  assessment completed. No breast cancer risk calculated due to patient is less than 49 years old.  Risk Assessment    Risk Scores      03/19/2020   Last edited by: Narda Rutherford, LPN   5-year risk:    Lifetime risk:           A: [facility] exam without pap smear Complaint of bilateral breast pain, right breast lump, and right clear nipple discharge.  P: Referred patient to the [facility] for a diagnostic mammogram. Appointment scheduled Tuesday, March 19, 2020 at 1500.  Priscille Heidelberg, RN 03/19/2020 9:28 AM

## 2020-03-19 NOTE — Patient Instructions (Signed)
Explained breast self awareness with [facility]. Patient did not need a Pap smear today due to last Pap smear and HPV typing was 08/04/2017. Let her know [facility] will cover Pap smears and HPV typing every 5 years unless has a history of abnormal Pap smears. Referred patient to the [facility] for a diagnostic mammogram. Appointment scheduled Tuesday, March 19, 2020 at 1500. Patient aware of appointment and will be there. Let patient know will follow up with her within the next week with results of breast discharge by phone. Madison Kent verbalized understanding.  [name], [name], RN 9:28 AM

## 2020-03-20 LAB — CYTOLOGY - NON PAP

## 2020-03-22 ENCOUNTER — Telehealth: Payer: Self-pay

## 2020-03-22 NOTE — Telephone Encounter (Signed)
Attempted to contact patient regarding lab results. Left message on voicemail requesting return call.  

## 2020-03-25 ENCOUNTER — Telehealth: Payer: Self-pay

## 2020-03-25 NOTE — Telephone Encounter (Signed)
Patient informed pathology results for bilateral breast discharge, "no malignant cells seen". Patient verbalized understanding.

## 2021-06-09 IMAGING — MG DIGITAL DIAGNOSTIC BILAT W/ TOMO W/ CAD
8 series · 9 of 24 positions shown · non-contrast
Comparison: None.

CLINICAL DATA: Patient reports diffuse bilateral breast pain which
comes and goes. Symptoms for 2 years.

EXAM:
DIGITAL DIAGNOSTIC BILATERAL MAMMOGRAM WITH TOMOSYNTHESIS AND CAD
TECHNIQUE: Bilateral digital diagnostic mammography and breast tomosynthesis
was performed. The images were evaluated with computer-aided
detection.

[R MLO synth-2D]
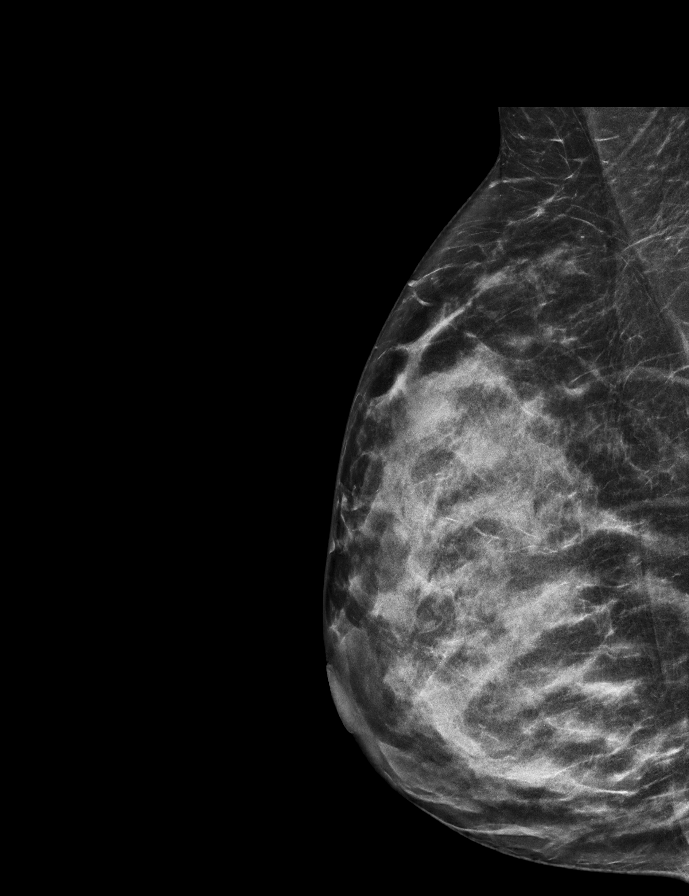

[L MLO synth-2D]
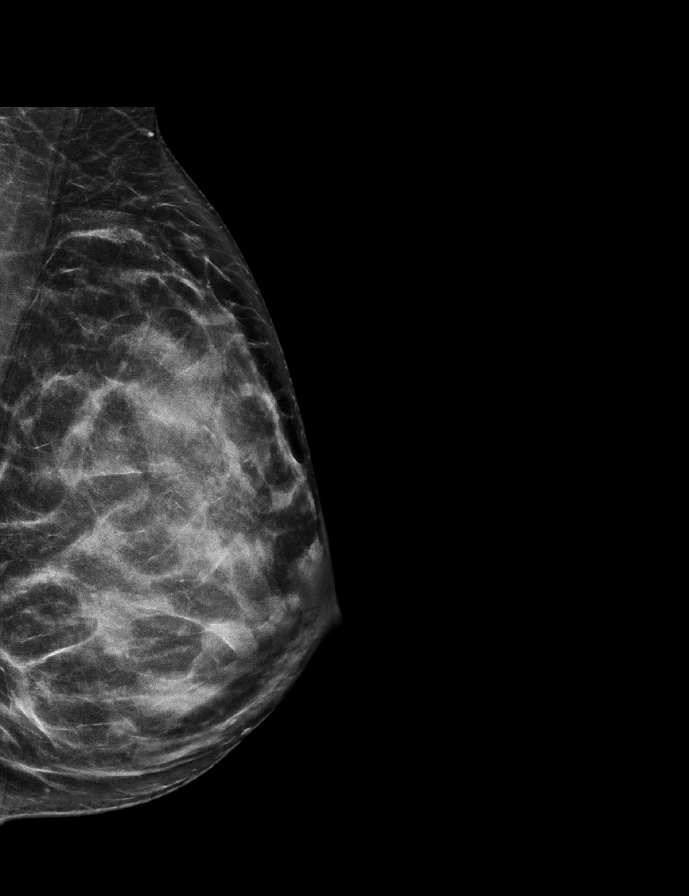

[L CC synth-2D]
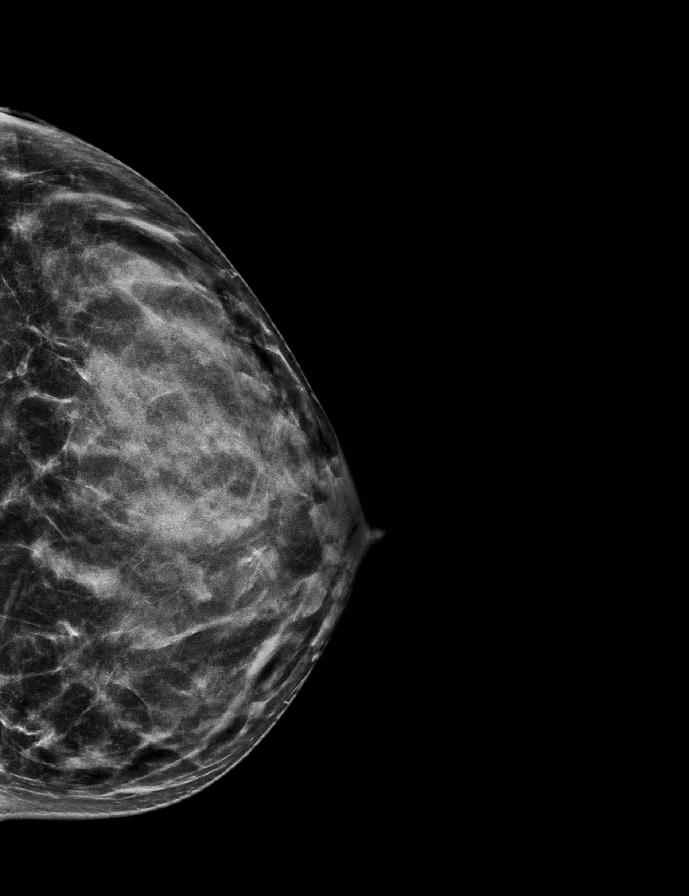

[R CC synth-2D]
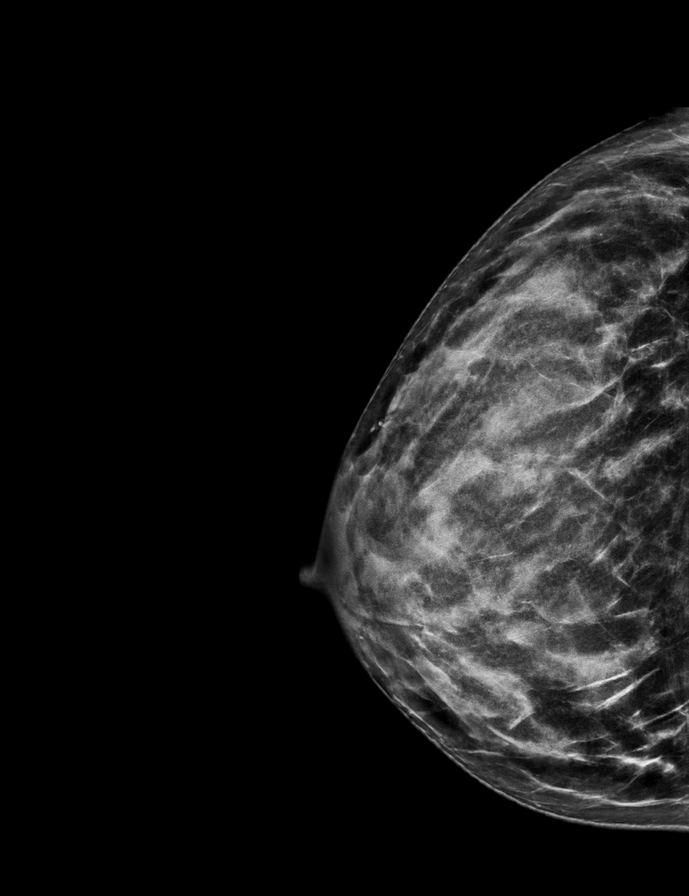

[L MLO tomo · 2 of 63 frames shown]
[frame 21/63]
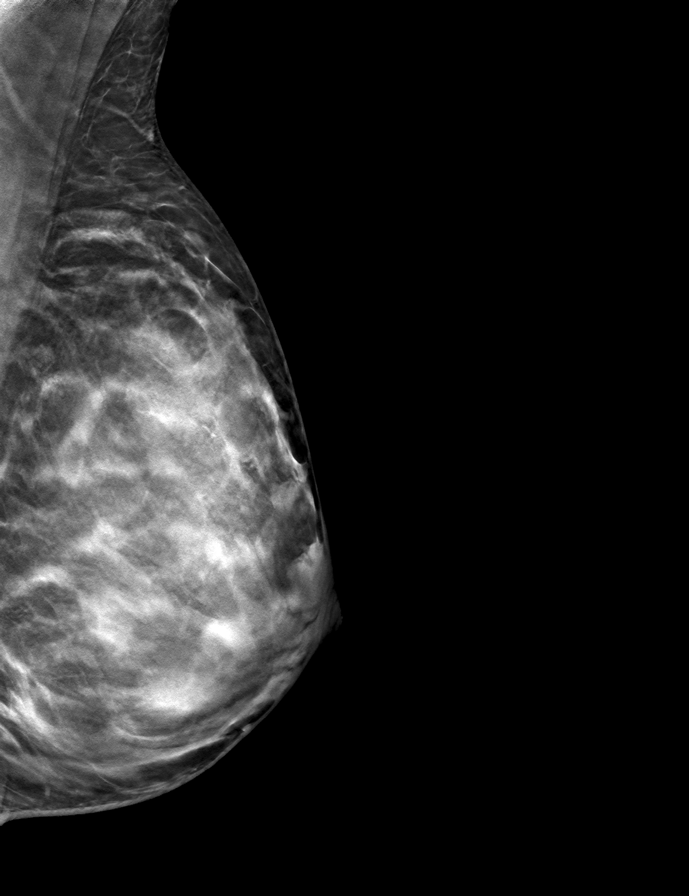
[frame 32/63]
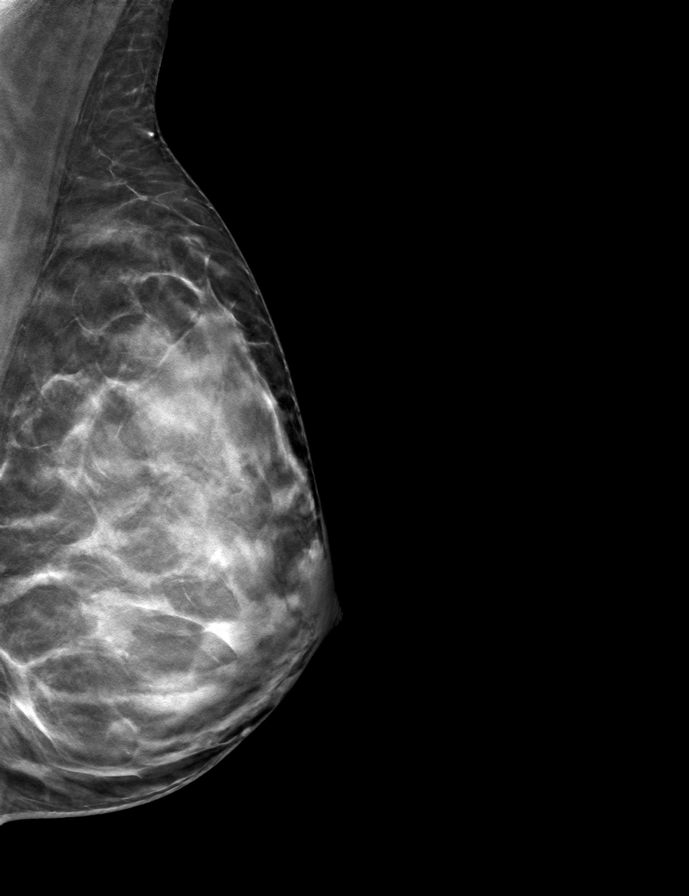

[L CC tomo · tomo slice 30/59.0]
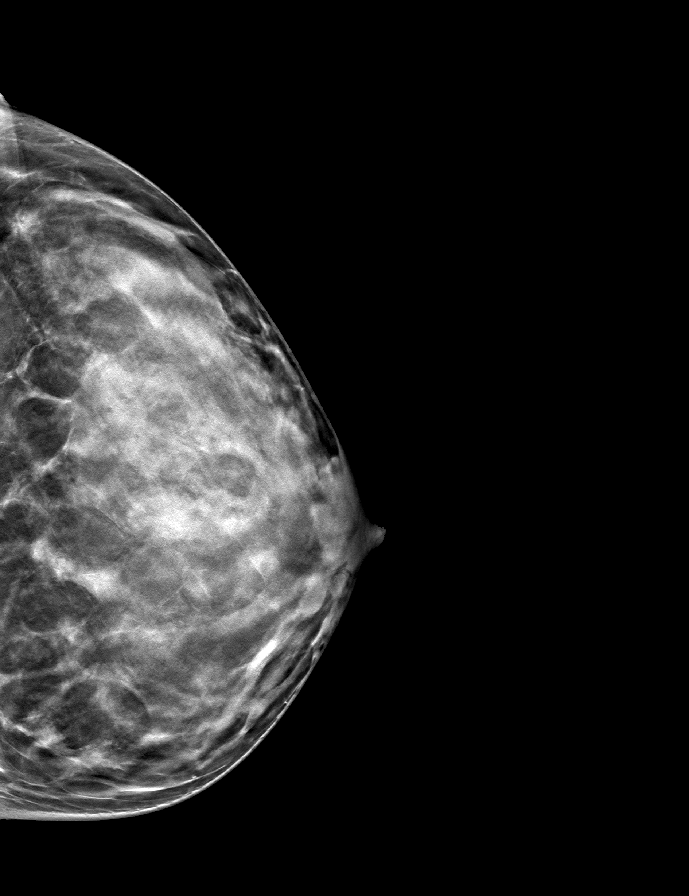

[R CC tomo · tomo slice 31/60.0]
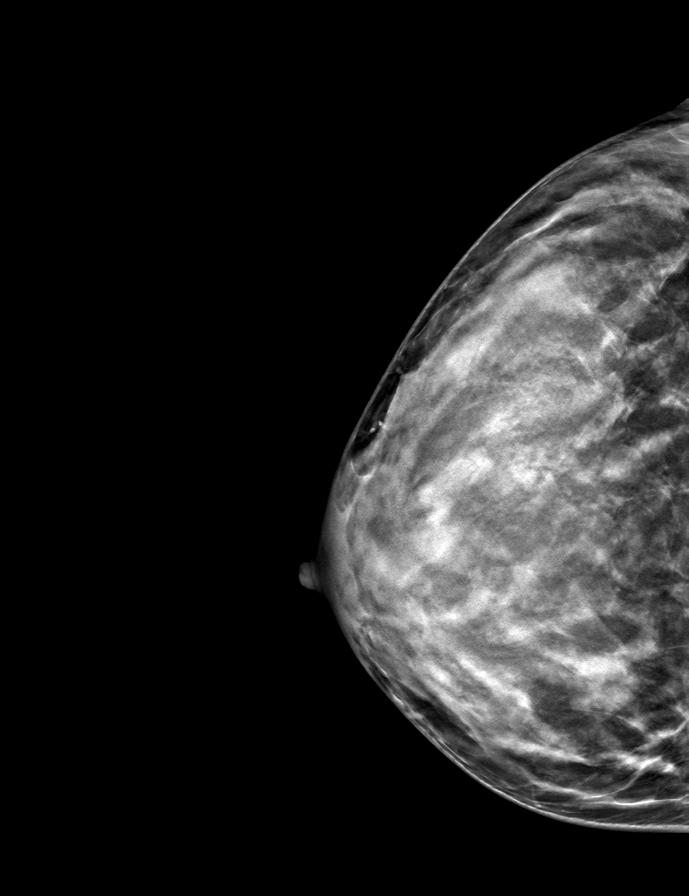

[R MLO tomo · tomo slice 31/61.0]
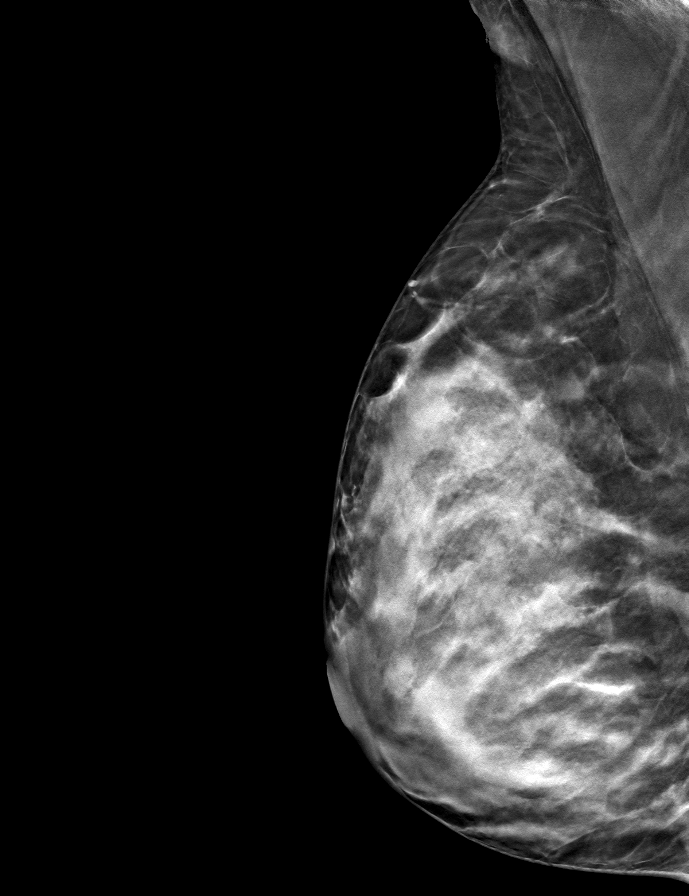

[9 of 24 positions shown; findings below may reference images not displayed]

ACR Breast Density Category c: The breast tissue is heterogeneously
dense, which may obscure small masses.
FINDINGS: No suspicious mass, distortion, or microcalcifications are
identified to suggest presence of malignancy.
IMPRESSION: No mammographic evidence for malignancy.

RECOMMENDATION:
Screening mammogram at age 42 unless there are persistent or
intervening clinical concerns. (Code:[id number]-K-G7Y)

I have discussed the findings and recommendations with the patient.
If applicable, a reminder letter will be sent to the patient
regarding the next appointment.

BI-RADS CATEGORY  1: Negative.
